# Patient Record
Sex: Female | Born: 1985 | Race: White | Hispanic: No | Marital: Married | Smoking: Never smoker
Health system: Southern US, Community
[De-identification: ages and names within clinical notes are randomized; demographics above are authoritative.]

## PROBLEM LIST (undated history)

## (undated) DIAGNOSIS — O139 Gestational [pregnancy-induced] hypertension without significant proteinuria, unspecified trimester: Secondary | ICD-10-CM

## (undated) DIAGNOSIS — Z8742 Personal history of other diseases of the female genital tract: Secondary | ICD-10-CM

## (undated) HISTORY — DX: Personal history of other diseases of the female genital tract: Z87.42

## (undated) HISTORY — PX: WISDOM TOOTH EXTRACTION: SHX21

---

## 2018-08-16 DIAGNOSIS — E282 Polycystic ovarian syndrome: Secondary | ICD-10-CM | POA: Insufficient documentation

## 2019-11-26 ENCOUNTER — Encounter: Payer: Self-pay | Admitting: *Deleted

## 2019-11-30 ENCOUNTER — Encounter: Payer: Self-pay | Admitting: Certified Nurse Midwife

## 2019-11-30 ENCOUNTER — Other Ambulatory Visit (HOSPITAL_COMMUNITY)
Admission: RE | Admit: 2019-11-30 | Discharge: 2019-11-30 | Disposition: A | Discharge status: Home / Self Care | Payer: Self-pay | Source: Ambulatory Visit | Attending: Certified Nurse Midwife | Admitting: Certified Nurse Midwife

## 2019-11-30 ENCOUNTER — Other Ambulatory Visit: Payer: Self-pay

## 2019-11-30 ENCOUNTER — Ambulatory Visit (INDEPENDENT_AMBULATORY_CARE_PROVIDER_SITE_OTHER): Payer: Self-pay | Admitting: Certified Nurse Midwife

## 2019-11-30 VITALS — BP 127/79 | HR 61 | Ht 67.0 in | Wt 219.0 lb

## 2019-11-30 DIAGNOSIS — Z3480 Encounter for supervision of other normal pregnancy, unspecified trimester: Secondary | ICD-10-CM

## 2019-11-30 DIAGNOSIS — O0990 Supervision of high risk pregnancy, unspecified, unspecified trimester: Secondary | ICD-10-CM | POA: Insufficient documentation

## 2019-11-30 DIAGNOSIS — O210 Mild hyperemesis gravidarum: Secondary | ICD-10-CM

## 2019-11-30 DIAGNOSIS — O09521 Supervision of elderly multigravida, first trimester: Secondary | ICD-10-CM

## 2019-11-30 DIAGNOSIS — O99210 Obesity complicating pregnancy, unspecified trimester: Secondary | ICD-10-CM

## 2019-11-30 DIAGNOSIS — O09529 Supervision of elderly multigravida, unspecified trimester: Secondary | ICD-10-CM | POA: Insufficient documentation

## 2019-11-30 DIAGNOSIS — Z8759 Personal history of other complications of pregnancy, childbirth and the puerperium: Secondary | ICD-10-CM | POA: Insufficient documentation

## 2019-11-30 MED ORDER — ASPIRIN EC 81 MG PO TBEC: 81.0000 mg | DELAYED_RELEASE_TABLET | Freq: Every day | ORAL | 3 refills | Status: DC

## 2019-11-30 NOTE — Progress Notes (Signed)
Subjective:   Alice Waters is a 34 y.o. G4P3000 at [redacted]w[redacted]d by early ultrasound being seen today for her first obstetrical visit.  Her obstetrical history is significant for advanced maternal age, macrosomia and obesity. Patient does intend to breast feed. Pregnancy history fully reviewed.  Patient reports nausea.  HISTORY: OB History  Gravida Para Term Preterm AB Living  4 3 3  0 0 3  SAB TAB Ectopic Multiple Live Births  0 0 0 0 3    # Outcome Date GA Lbr Len/2nd Weight Sex Delivery Anes PTL Lv  4 Current           3 Term 05/23/13 [redacted]w[redacted]d  10 lb 2.3 oz (4.6 kg) M Vag-Spont   LIV     Birth Comments: IOL, no SD  2 Term 08/26/10 110w0d  9 lb 12 oz (4.423 kg) F Vag-Spont   LIV     Birth Comments: poly, IOL  1 Term 09/24/07 [redacted]w[redacted]d  8 lb 11 oz (3.941 kg) F Vag-Spont   LIV     Birth Comments: poly, IOL, chorio   Past Medical History:  Diagnosis Date  . History of PCOS    Past Surgical History:  Procedure Laterality Date  . WISDOM TOOTH EXTRACTION     Family History  Problem Relation Age of Onset  . Thyroid disease Mother   . Hypertension Father   . COPD Father   . Congestive Heart Failure Father   . Breast cancer Paternal Grandmother    Social History   Tobacco Use  . Smoking status: Never Smoker  . Smokeless tobacco: Never Used  Vaping Use  . Vaping Use: Never used  Substance Use Topics  . Alcohol use: Never  . Drug use: Never   Allergies  Allergen Reactions  . Penicillins Anaphylaxis, Hives and Rash   No current outpatient medications on file prior to visit.   No current facility-administered medications on file prior to visit.    Indications for ASA therapy (per uptodate) One of the following: Previous pregnancy with preeclampsia, especially early onset and with an adverse outcome No Multifetal gestation No Chronic hypertension No Type 1 or 2 diabetes mellitus No Chronic kidney disease No Autoimmune disease (antiphospholipid syndrome, systemic lupus  erythematosus) No  Two or more of the following: Nulliparity No Obesity (body mass index >30 kg/m2) Yes Family history of preeclampsia in mother or sister No Age ?35 years Yes Sociodemographic characteristics (African American race, low socioeconomic level) No Personal risk factors (eg, previous pregnancy with low birth weight or small for gestational age infant, previous adverse pregnancy outcome [eg, stillbirth], interval >10 years between pregnancies) No  Indications for early 1 hour GTT (per uptodate)  BMI >25 (>23 in Asian women) AND one of the following  Gestational diabetes mellitus in a previous pregnancy No Glycated hemoglobin ?5.7 percent (39 mmol/mol), impaired glucose tolerance, or impaired fasting glucose on previous testing No First-degree relative with diabetes No High-risk race/ethnicity (eg, African American, Latino, Native American, [redacted]w[redacted]d American, Pacific Islander) No History of cardiovascular disease No Hypertension or on therapy for hypertension No High-density lipoprotein cholesterol level <35 mg/dL ([place] mmol/L) and/or a triglyceride level >250 mg/dL (6.07 mmol/L) No Polycystic ovary syndrome Yes Physical inactivity No Other clinical condition associated with insulin resistance (eg, severe obesity, acanthosis nigricans) No Previous birth of an infant weighing ?4000 g Yes Previous stillbirth of unknown cause No  Exam   Vitals:   11/30/19 0951 11/30/19 0953  BP: 127/79   Pulse:  61   Weight: 219 lb (99.3 kg)   Height:  5\' 7"  (1.702 m)   Fetal Heart Rate (bpm): 183  Uterus:     Pelvic Exam: Perineum: no hemorrhoids, normal perineum   Vulva: normal external genitalia, no lesions   Vagina:  normal mucosa, normal discharge   Cervix: no lesions and normal, pap smear done    Adnexa: normal adnexa and no mass, fullness, tenderness   Bony Pelvis: average  System: General: well-developed, well-nourished female in no acute distress   Breast:  normal appearance,  no masses or tenderness   Skin: normal coloration and turgor, no rashes   Neurologic: oriented, normal, negative, normal mood   Extremities: normal strength, tone, and muscle mass, ROM of all joints is normal   HEENT PERRLA, extraocular movement intact and sclera clear, anicteric   Mouth/Teeth mucous membranes moist, pharynx normal without lesions and dental hygiene good   Neck supple and no masses   Cardiovascular: regular rate and rhythm   Respiratory:  no respiratory distress, normal breath sounds   Abdomen: soft, non-tender; bowel sounds normal; no masses,  no organomegaly     Assessment:   Pregnancy: G4P3000 Patient Active Problem List   Diagnosis Date Noted  . Supervision of other normal pregnancy, antepartum 11/30/2019  . Advanced maternal age in multigravida 11/30/2019  . History of delivery of macrosomal infant 11/30/2019  . Obesity in pregnancy 11/30/2019  . PCOS (polycystic ovarian syndrome) 08/16/2018    Plan:  1. Supervision of other normal pregnancy, antepartum - Obstetric panel - HIV antibody (with reflex) - Hepatitis C Antibody - Hemoglobinopathy Evaluation - Culture, OB Urine - Cytology - PAP( [place]) - Babyscripts Schedule Optimization - 08/18/2018 bedside; Future  2. Multigravida of advanced maternal age in first trimester - growth [place] in 3rd tri  Initial labs drawn. Early 2 hour GTT planned Start on ASA after 12 weeks Continue prenatal vitamins. Genetic Screening discussed, First trimester screen, Quad screen and NIPS: declined. Ultrasound discussed; fetal anatomic survey: requested. Problem list reviewed and updated The nature of [facility] with multiple MDs and other Advanced Practice Providers was explained to patient; also emphasized that fellows, residents, and students are part of our team. Routine obstetric precautions reviewed Return in about 5 weeks (around 01/04/2020).   01/06/2020, CNM 1:14 PM 11/30/19

## 2019-11-30 NOTE — Progress Notes (Signed)
Bedside U/S shows single IUP with FHT pf 183 BPM and CRL measures 24.60mm  GA [redacted]w[redacted]d

## 2019-11-30 NOTE — Patient Instructions (Signed)
First Trimester of Pregnancy The first trimester of pregnancy is from week 1 until the end of week 13 (months 1 through 3). A week after a sperm fertilizes an egg, the egg will implant on the wall of the uterus. This embryo will begin to develop into a baby. Genes from you and your partner will form the baby. The female genes will determine whether the baby will be a boy or a girl. At 6-8 weeks, the eyes and face will be formed, and the heartbeat can be seen on ultrasound. At the end of 12 weeks, all the baby's organs will be formed. Now that you are pregnant, you will want to do everything you can to have a healthy baby. Two of the most important things are to get good prenatal care and to follow your health care provider's instructions. Prenatal care is all the medical care you receive before the baby's birth. This care will help prevent, find, and treat any problems during the pregnancy and childbirth. Body changes during your first trimester Your body goes through many changes during pregnancy. The changes vary from woman to woman.  You may gain or lose a couple of pounds at first.  You may feel sick to your stomach (nauseous) and you may throw up (vomit). If the vomiting is uncontrollable, call your health care provider.  You may tire easily.  You may develop headaches that can be relieved by medicines. All medicines should be approved by your health care provider.  You may urinate more often. Painful urination may mean you have a bladder infection.  You may develop heartburn as a result of your pregnancy.  You may develop constipation because certain hormones are causing the muscles that push stool through your intestines to slow down.  You may develop hemorrhoids or swollen veins (varicose veins).  Your breasts may begin to grow larger and become tender. Your nipples may stick out more, and the tissue that surrounds them (areola) may become darker.  Your gums may bleed and may be  sensitive to brushing and flossing.  Dark spots or blotches (chloasma, mask of pregnancy) may develop on your face. This will likely fade after the baby is born.  Your menstrual periods will stop.  You may have a loss of appetite.  You may develop cravings for certain kinds of food.  You may have changes in your emotions from day to day, such as being excited to be pregnant or being concerned that something may go wrong with the pregnancy and baby.  You may have more vivid and strange dreams.  You may have changes in your hair. These can include thickening of your hair, rapid growth, and changes in texture. Some women also have hair loss during or after pregnancy, or hair that feels dry or thin. Your hair will most likely return to normal after your baby is born. What to expect at prenatal visits During a routine prenatal visit:  You will be weighed to make sure you and the baby are growing normally.  Your blood pressure will be taken.  Your abdomen will be measured to track your baby's growth.  The fetal heartbeat will be listened to between weeks 10 and 14 of your pregnancy.  Test results from any previous visits will be discussed. Your health care provider may ask you:  How you are feeling.  If you are feeling the baby move.  If you have had any abnormal symptoms, such as leaking fluid, bleeding, severe headaches, or abdominal   cramping.  If you are using any tobacco products, including cigarettes, chewing tobacco, and electronic cigarettes.  If you have any questions. Other tests that may be performed during your first trimester include:  Blood tests to find your blood type and to check for the presence of any previous infections. The tests will also be used to check for low iron levels (anemia) and protein on red blood cells (Rh antibodies). Depending on your risk factors, or if you previously had diabetes during pregnancy, you may have tests to check for high blood sugar  that affects pregnant women (gestational diabetes).  Urine tests to check for infections, diabetes, or protein in the urine.  An ultrasound to confirm the proper growth and development of the baby.  Fetal screens for spinal cord problems (spina bifida) and Down syndrome.  HIV (human immunodeficiency virus) testing. Routine prenatal testing includes screening for HIV, unless you choose not to have this test.  You may need other tests to make sure you and the baby are doing well. Follow these instructions at home: Medicines  Follow your health care provider's instructions regarding medicine use. Specific medicines may be either safe or unsafe to take during pregnancy.  Take a prenatal vitamin that contains at least 600 micrograms (mcg) of folic acid.  If you develop constipation, try taking a stool softener if your health care provider approves. Eating and drinking   Eat a balanced diet that includes fresh fruits and vegetables, whole grains, good sources of protein such as meat, eggs, or tofu, and low-fat dairy. Your health care provider will help you determine the amount of weight gain that is right for you.  Avoid raw meat and uncooked cheese. These carry germs that can cause birth defects in the baby.  Eating four or five small meals rather than three large meals a day may help relieve nausea and vomiting. If you start to feel nauseous, eating a few soda crackers can be helpful. Drinking liquids between meals, instead of during meals, also seems to help ease nausea and vomiting.  Limit foods that are high in fat and processed sugars, such as fried and sweet foods.  To prevent constipation: ? Eat foods that are high in fiber, such as fresh fruits and vegetables, whole grains, and beans. ? Drink enough fluid to keep your urine clear or pale yellow. Activity  Exercise only as directed by your health care provider. Most women can continue their usual exercise routine during  pregnancy. Try to exercise for 30 minutes at least 5 days a week. Exercising will help you: ? Control your weight. ? Stay in shape. ? Be prepared for labor and delivery.  Experiencing pain or cramping in the lower abdomen or lower back is a good sign that you should stop exercising. Check with your health care provider before continuing with normal exercises.  Try to avoid standing for long periods of time. Move your legs often if you must stand in one place for a long time.  Avoid heavy lifting.  Wear low-heeled shoes and practice good posture.  You may continue to have sex unless your health care provider tells you not to. Relieving pain and discomfort  Wear a good support bra to relieve breast tenderness.  Take warm sitz baths to soothe any pain or discomfort caused by hemorrhoids. Use hemorrhoid cream if your health care provider approves.  Rest with your legs elevated if you have leg cramps or low back pain.  If you develop varicose veins in   your legs, wear support hose. Elevate your feet for 15 minutes, 3-4 times a day. Limit salt in your diet. Prenatal care  Schedule your prenatal visits by the twelfth week of pregnancy. They are usually scheduled monthly at first, then more often in the last 2 months before delivery.  Write down your questions. Take them to your prenatal visits.  Keep all your prenatal visits as told by your health care provider. This is important. Safety  Wear your seat belt at all times when driving.  Make a list of emergency phone numbers, including numbers for family, friends, the hospital, and police and fire departments. General instructions  Ask your health care provider for a referral to a local prenatal education class. Begin classes no later than the beginning of month 6 of your pregnancy.  Ask for help if you have counseling or nutritional needs during pregnancy. Your health care provider can offer advice or refer you to specialists for help  with various needs.  Do not use hot tubs, steam rooms, or saunas.  Do not douche or use tampons or scented sanitary pads.  Do not cross your legs for long periods of time.  Avoid cat litter boxes and soil used by cats. These carry germs that can cause birth defects in the baby and possibly loss of the fetus by miscarriage or stillbirth.  Avoid all smoking, herbs, alcohol, and medicines not prescribed by your health care provider. Chemicals in these products affect the formation and growth of the baby.  Do not use any products that contain nicotine or tobacco, such as cigarettes and e-cigarettes. If you need help quitting, ask your health care provider. You may receive counseling support and other resources to help you quit.  Schedule a dentist appointment. At home, brush your teeth with a soft toothbrush and be gentle when you floss. Contact a health care provider if:  You have dizziness.  You have mild pelvic cramps, pelvic pressure, or nagging pain in the abdominal area.  You have persistent nausea, vomiting, or diarrhea.  You have a bad smelling vaginal discharge.  You have pain when you urinate.  You notice increased swelling in your face, hands, legs, or ankles.  You are exposed to fifth disease or chickenpox.  You are exposed to [place] measles (rubella) and have never had it. Get help right away if:  You have a fever.  You are leaking fluid from your vagina.  You have spotting or bleeding from your vagina.  You have severe abdominal cramping or pain.  You have rapid weight gain or loss.  You vomit blood or material that looks like coffee grounds.  You develop a severe headache.  You have shortness of breath.  You have any kind of trauma, such as from a fall or a car accident. Summary  The first trimester of pregnancy is from week 1 until the end of week 13 (months 1 through 3).  Your body goes through many changes during pregnancy. The changes vary from  woman to woman.  You will have routine prenatal visits. During those visits, your health care provider will examine you, discuss any test results you may have, and talk with you about how you are feeling. This information is not intended to replace advice given to you by your health care provider. Make sure you discuss any questions you have with your health care provider. Document Revised: 12/17/2016 Document Reviewed: 12/17/2015 Elsevier Patient Education  2020 Elsevier Inc.   Morning Sickness  Morning  sickness is when you feel sick to your stomach (nauseous) during pregnancy. You may feel sick to your stomach and throw up (vomit). You may feel sick in the morning, but you can feel this way at any time of day. Some women feel very sick to their stomach and cannot stop throwing up (hyperemesis gravidarum). Follow these instructions at home: Medicines  Take over-the-counter and prescription medicines only as told by your doctor. Do not take any medicines until you talk with your doctor about them first.  Taking multivitamins before getting pregnant can stop or lessen the harshness of morning sickness. Eating and drinking  Eat dry toast or crackers before getting out of bed.  Eat 5 or 6 small meals a day.  Eat dry and bland foods like rice and baked potatoes.  Do not eat greasy, fatty, or spicy foods.  Have someone cook for you if the smell of food causes you to feel sick or throw up.  If you feel sick to your stomach after taking prenatal vitamins, take them at night or with a snack.  Eat protein when you need a snack. Nuts, yogurt, and cheese are good choices.  Drink fluids throughout the day.  Try ginger ale made with real ginger, ginger tea made from fresh grated ginger, or ginger candies. General instructions  Do not use any products that have nicotine or tobacco in them, such as cigarettes and e-cigarettes. If you need help quitting, ask your doctor.  Use an air purifier  to keep the air in your house free of smells.  Get lots of fresh air.  Try to avoid smells that make you feel sick.  Try: ? Wearing a bracelet that is used for seasickness (acupressure wristband). ? Going to a doctor who puts thin needles into certain body points (acupuncture) to improve how you feel. Contact a doctor if:  You need medicine to feel better.  You feel dizzy or light-headed.  You are losing weight. Get help right away if:  You feel very sick to your stomach and cannot stop throwing up.  You pass out (faint).  You have very bad pain in your belly. Summary  Morning sickness is when you feel sick to your stomach (nauseous) during pregnancy.  You may feel sick in the morning, but you can feel this way at any time of day.  Making some changes to what you eat may help your symptoms go away. This information is not intended to replace advice given to you by your health care provider. Make sure you discuss any questions you have with your health care provider. Document Revised: 12/17/2016 Document Reviewed: 02/05/2016 Elsevier Patient Education  2020 [employer].                   Safe Medications in Pregnancy    Acne: Benzoyl Peroxide Salicylic Acid  Backache/Headache: Tylenol: 2 regular strength every 4 hours OR              2 Extra strength every 6 hours  Colds/Coughs/Allergies: Benadryl (alcohol free) 25 mg every 6 hours as needed Breath right strips Claritin Cepacol throat lozenges Chloraseptic throat spray Cold-Eeze- up to three times per day Cough drops, alcohol free Flonase (by prescription only) Guaifenesin Mucinex Robitussin DM (plain only, alcohol free) Saline nasal spray/drops Sudafed (pseudoephedrine) & Actifed ** use only after [redacted] weeks gestation and if you do not have high blood pressure Tylenol Vicks Vaporub Zinc lozenges Zyrtec   Constipation: Colace Ducolax suppositories Fleet  enema Glycerin suppositories Metamucil Milk  of magnesia Miralax Senokot Smooth move tea  Diarrhea: Kaopectate Imodium A-D  *NO pepto Bismol  Hemorrhoids: Anusol Anusol HC Preparation H Tucks  Indigestion: Tums Maalox Mylanta Zantac  Pepcid  Insomnia: Benadryl (alcohol free) 25mg  every 6 hours as needed Tylenol PM Unisom, no Gelcaps  Leg Cramps: Tums MagGel  Nausea/Vomiting:  Bonine Dramamine Emetrol Ginger extract Sea bands Meclizine  Nausea medication to take during pregnancy:  Unisom (doxylamine succinate 25 mg tablets) Take one tablet daily at bedtime. If symptoms are not adequately controlled, the dose can be increased to a maximum recommended dose of two tablets daily (1/2 tablet in the morning, 1/2 tablet mid-afternoon and one at bedtime). Vitamin B6 100mg  tablets. Take one tablet twice a day (up to 200 mg per day).  Skin Rashes: Aveeno products Benadryl cream or 25mg  every 6 hours as needed Calamine Lotion 1% cortisone cream  Yeast infection: Gyne-lotrimin 7 Monistat 7   **If taking multiple medications, please check labels to avoid duplicating the same active ingredients **take medication as directed on the label ** Do not exceed 4000 mg of tylenol in 24 hours **Do not take medications that contain aspirin or ibuprofen

## 2019-12-03 LAB — HEMOGLOBINOPATHY EVALUATION
Fetal Hemoglobin Testing: <1 % (ref 0.0–1.9)
HCT: 43.7 % (ref 35.0–45.0)
Hemoglobin A2 - HGBRFX: 2.3 % (ref 2.2–3.2)
Hemoglobin: 14 g/dL (ref 11.7–15.5)
Hgb A: 97.7 % (ref >96.0)
MCH: 29.7 pg (ref 27.0–33.0)
MCV: 92.6 fL (ref 80.0–100.0)
RBC: 4.72 10*6/uL (ref 3.80–5.10)
RDW: 12.5 % (ref 11.0–15.0)

## 2019-12-03 LAB — OBSTETRIC PANEL
Absolute Monocytes: 863 cells/uL (ref 200–950)
Antibody Screen: NOT DETECTED
Basophils Absolute: 38 cells/uL (ref 0–200)
Basophils Relative: 0.3 %
Eosinophils Absolute: 75 cells/uL (ref 15–500)
Eosinophils Relative: 0.6 %
HCT: 41.6 % (ref 35.0–45.0)
Hemoglobin: 14.1 g/dL (ref 11.7–15.5)
Hepatitis B Surface Ag: NONREACTIVE
Lymphs Abs: 1625 cells/uL (ref 850–3900)
MCH: 30.5 pg (ref 27.0–33.0)
MCHC: 33.9 g/dL (ref 32.0–36.0)
MCV: 89.8 fL (ref 80.0–100.0)
MPV: 10.9 fL (ref 7.5–12.5)
Monocytes Relative: 6.9 %
Neutro Abs: 9900 cells/uL — ABNORMAL HIGH (ref 1500–7800)
Neutrophils Relative %: 79.2 %
Platelets: 321 10*3/uL (ref 140–400)
RBC: 4.63 10*6/uL (ref 3.80–5.10)
RDW: 12.6 % (ref 11.0–15.0)
RPR Ser Ql: NONREACTIVE
Rubella: 1.68 Index
Total Lymphocyte: 13 %
WBC: 12.5 10*3/uL — ABNORMAL HIGH (ref 3.8–10.8)

## 2019-12-03 LAB — HEPATITIS C ANTIBODY
Hepatitis C Ab: NONREACTIVE
SIGNAL TO CUT-OFF: 0.01 (ref <1.00)

## 2019-12-03 LAB — URINE CULTURE, OB REFLEX

## 2019-12-03 LAB — CULTURE, OB URINE

## 2019-12-03 LAB — HIV ANTIBODY (ROUTINE TESTING W REFLEX): HIV 1&2 Ab, 4th Generation: NONREACTIVE

## 2019-12-04 LAB — CYTOLOGY - PAP
Chlamydia: NEGATIVE
Comment: NEGATIVE
Comment: NEGATIVE
Comment: NORMAL
Diagnosis: NEGATIVE
High risk HPV: NEGATIVE
Neisseria Gonorrhea: NEGATIVE

## 2020-01-04 ENCOUNTER — Telehealth (INDEPENDENT_AMBULATORY_CARE_PROVIDER_SITE_OTHER): Payer: Self-pay | Admitting: Certified Nurse Midwife

## 2020-01-04 ENCOUNTER — Telehealth: Payer: Self-pay | Admitting: *Deleted

## 2020-01-04 ENCOUNTER — Other Ambulatory Visit: Payer: Self-pay | Admitting: Certified Nurse Midwife

## 2020-01-04 ENCOUNTER — Other Ambulatory Visit: Payer: Self-pay

## 2020-01-04 VITALS — BP 116/89 | HR 72 | Wt 217.0 lb

## 2020-01-04 DIAGNOSIS — Z3480 Encounter for supervision of other normal pregnancy, unspecified trimester: Secondary | ICD-10-CM

## 2020-01-04 DIAGNOSIS — Z363 Encounter for antenatal screening for malformations: Secondary | ICD-10-CM

## 2020-01-04 DIAGNOSIS — Z3A19 19 weeks gestation of pregnancy: Secondary | ICD-10-CM

## 2020-01-04 DIAGNOSIS — Z3A14 14 weeks gestation of pregnancy: Secondary | ICD-10-CM

## 2020-01-04 DIAGNOSIS — O09529 Supervision of elderly multigravida, unspecified trimester: Secondary | ICD-10-CM

## 2020-01-04 DIAGNOSIS — O09522 Supervision of elderly multigravida, second trimester: Secondary | ICD-10-CM

## 2020-01-04 DIAGNOSIS — Z8759 Personal history of other complications of pregnancy, childbirth and the puerperium: Secondary | ICD-10-CM

## 2020-01-04 DIAGNOSIS — O99212 Obesity complicating pregnancy, second trimester: Secondary | ICD-10-CM

## 2020-01-04 DIAGNOSIS — O99210 Obesity complicating pregnancy, unspecified trimester: Secondary | ICD-10-CM

## 2020-01-04 DIAGNOSIS — E669 Obesity, unspecified: Secondary | ICD-10-CM

## 2020-01-04 DIAGNOSIS — O09292 Supervision of pregnancy with other poor reproductive or obstetric history, second trimester: Secondary | ICD-10-CM

## 2020-01-04 NOTE — Telephone Encounter (Signed)
Left patient a message with appointment information for MFM on 02/07/2020. Needs to arrive at 10:30 AM for 10:45 AM appointment.

## 2020-01-04 NOTE — Progress Notes (Signed)
Pt declines genetics

## 2020-01-04 NOTE — Progress Notes (Signed)
° °  TELEHEALTH OBSTETRICS VISIT ENCOUNTER NOTE  I connected with Alice Waters on 01/04/20 at  8:10 AM EST by telephone at home and verified that I am speaking with the correct person using two identifiers.   I discussed the limitations, risks, security and privacy concerns of performing an evaluation and management service by telephone and the availability of in person appointments. I also discussed with the patient that there may be a patient responsible charge related to this service. The patient expressed understanding and agreed to proceed.  Subjective:  Alice Waters is a 33 y.o. G4P3003 at [redacted]w[redacted]d being followed for ongoing prenatal care.  She is currently monitored for the following issues for this low-risk pregnancy and has PCOS (polycystic ovarian syndrome); Supervision of other normal pregnancy, antepartum; Advanced maternal age in multigravida; History of delivery of macrosomal infant; and Obesity in pregnancy on their problem list.  Patient reports nausea and vomiting. Feels nausea daily but vomiting occasionally. Used Unisom and B6 a few times and peppermint tea helps. Reports usually resolved in previous pregnancies around 15 weeks. Reports fetal movement. Denies any contractions, bleeding or leaking of fluid.   The following portions of the patient's history were reviewed and updated as appropriate: allergies, current medications, past family history, past medical history, past social history, past surgical history and problem list.   Objective:   General:  Alert, oriented and cooperative.   Mental Status: Normal mood and affect perceived. Normal judgment and thought content.  Rest of physical exam deferred due to type of encounter  Assessment and Plan:  Pregnancy: G4P3003 at [redacted]w[redacted]d 1. [redacted] weeks gestation of pregnancy  2. Supervision of other normal pregnancy, antepartum - anatomy US at 18-19w 3. Antepartum multigravida of advanced maternal age  63. History of delivery of  macrosomal infant - needs early GTT  5. Obesity in pregnancy  Preterm labor symptoms and general obstetric precautions including but not limited to vaginal bleeding, contractions, leaking of fluid and fetal movement were reviewed in detail with the patient.  I discussed the assessment and treatment plan with the patient. The patient was provided an opportunity to ask questions and all were answered. The patient agreed with the plan and demonstrated an understanding of the instructions. The patient was advised to call back or seek an in-person office evaluation/go to [facility] at [facility] for any urgent or concerning symptoms. Please refer to After Visit Summary for other counseling recommendations.   I provided 7 minutes of non-face-to-face time during this encounter.  Return in about 6 weeks (around 02/15/2020). in-person or virtual  No future appointments.  Donette Larry, CNM [facility] [employer]

## 2020-01-19 NOTE — L&D Delivery Note (Addendum)
Delivery Note Alice Waters is a 35 y.o. G4P3003 at [redacted]w[redacted]d admitted for IOL for gHTN.   GBS Status: Positive/-- (05/20 0000) treated with clindamycin (PCN allergy) Maximum Maternal Temperature: 98.31F  Labor course: Initial SVE: 2/thick/-3. Augmentation with: AROM, Pitocin and Cytotec. She then progressed to complete.  ROM: 4h 72m with clear fluid  Birth: At 2247 a viable female was delivered via spontaneous vaginal delivery (Presentation: ROA  ). Nuchal cord present: No.  Shoulders and body delivered in usual fashion. Infant placed directly on mom's abdomen for bonding/skin-to-skin, baby dried and stimulated. Cord clamped x 2 after 1 minute and cut by FOB.  Cord blood collected.  The placenta separated spontaneously and delivered via gentle cord traction.  Pitocin infused rapidly IV per protocol.  Fundus firm with massage.  Placenta inspected and appears to be intact with a 3 VC.  Placenta/Cord with the following complications: none .  Cord pH: n/a Sponge and instrument count were correct x2.  Intrapartum complications:  PIH Anesthesia:  epidural Episiotomy: none Lacerations:  2nd degree and vaginal Suture Repair: 3.0 vicryl EBL (mL): 628   Infant: APGAR (1 MIN):  8 APGAR (5 MINS):   9 Infant weight: pending  Mom to postpartum.  Baby to Couplet care / Skin to Skin. Placenta to L&D   Plans to Breastfeed Contraception: vasectomy/NFP Circumcision: N/A  Note sent to [facility]: KV for pp visit.  [name], MD 06/12/2020 11:36 PM  The above was performed under my direct supervision and guidance.

## 2020-01-21 ENCOUNTER — Other Ambulatory Visit: Payer: Self-pay

## 2020-01-21 ENCOUNTER — Other Ambulatory Visit (INDEPENDENT_AMBULATORY_CARE_PROVIDER_SITE_OTHER): Payer: Self-pay

## 2020-01-21 DIAGNOSIS — O99210 Obesity complicating pregnancy, unspecified trimester: Secondary | ICD-10-CM

## 2020-01-21 DIAGNOSIS — Z3480 Encounter for supervision of other normal pregnancy, unspecified trimester: Secondary | ICD-10-CM

## 2020-01-21 NOTE — Progress Notes (Signed)
Pt here for early GTT only 

## 2020-01-22 LAB — 2HR GTT W 1 HR, CARPENTER, 75 G
Glucose, 1 Hr, Gest: 118 mg/dL (ref 65–179)
Glucose, 2 Hr, Gest: 98 mg/dL (ref 65–152)
Glucose, Fasting, Gest: 79 mg/dL (ref 65–91)

## 2020-01-28 ENCOUNTER — Telehealth: Payer: Self-pay

## 2020-01-28 NOTE — Telephone Encounter (Signed)
01/29/20 GFE being mailed 

## 2020-01-28 NOTE — Telephone Encounter (Signed)
Calculated the patient's GFE for their upcoming appointment.   Mailed GFE to patient.

## 2020-02-07 ENCOUNTER — Other Ambulatory Visit: Payer: Self-pay

## 2020-02-07 ENCOUNTER — Ambulatory Visit: Payer: Self-pay | Attending: Certified Nurse Midwife

## 2020-02-07 ENCOUNTER — Other Ambulatory Visit: Payer: Self-pay | Admitting: *Deleted

## 2020-02-07 ENCOUNTER — Other Ambulatory Visit: Payer: Self-pay | Admitting: Certified Nurse Midwife

## 2020-02-07 DIAGNOSIS — Z363 Encounter for antenatal screening for malformations: Secondary | ICD-10-CM | POA: Insufficient documentation

## 2020-02-07 DIAGNOSIS — Z3A19 19 weeks gestation of pregnancy: Secondary | ICD-10-CM

## 2020-02-07 DIAGNOSIS — Z362 Encounter for other antenatal screening follow-up: Secondary | ICD-10-CM

## 2020-02-07 DIAGNOSIS — Z3480 Encounter for supervision of other normal pregnancy, unspecified trimester: Secondary | ICD-10-CM | POA: Insufficient documentation

## 2020-02-15 ENCOUNTER — Telehealth (INDEPENDENT_AMBULATORY_CARE_PROVIDER_SITE_OTHER): Payer: Self-pay | Admitting: Certified Nurse Midwife

## 2020-02-15 DIAGNOSIS — Z8759 Personal history of other complications of pregnancy, childbirth and the puerperium: Secondary | ICD-10-CM

## 2020-02-15 DIAGNOSIS — O99210 Obesity complicating pregnancy, unspecified trimester: Secondary | ICD-10-CM

## 2020-02-15 DIAGNOSIS — E669 Obesity, unspecified: Secondary | ICD-10-CM

## 2020-02-15 DIAGNOSIS — O09529 Supervision of elderly multigravida, unspecified trimester: Secondary | ICD-10-CM

## 2020-02-15 DIAGNOSIS — R109 Unspecified abdominal pain: Secondary | ICD-10-CM

## 2020-02-15 DIAGNOSIS — Z3A20 20 weeks gestation of pregnancy: Secondary | ICD-10-CM

## 2020-02-15 DIAGNOSIS — Z3480 Encounter for supervision of other normal pregnancy, unspecified trimester: Secondary | ICD-10-CM

## 2020-02-15 DIAGNOSIS — O99212 Obesity complicating pregnancy, second trimester: Secondary | ICD-10-CM

## 2020-02-15 DIAGNOSIS — O09522 Supervision of elderly multigravida, second trimester: Secondary | ICD-10-CM

## 2020-02-15 DIAGNOSIS — E282 Polycystic ovarian syndrome: Secondary | ICD-10-CM

## 2020-02-15 DIAGNOSIS — O26892 Other specified pregnancy related conditions, second trimester: Secondary | ICD-10-CM

## 2020-02-15 DIAGNOSIS — O99282 Endocrine, nutritional and metabolic diseases complicating pregnancy, second trimester: Secondary | ICD-10-CM

## 2020-02-15 NOTE — Patient Instructions (Signed)

## 2020-02-15 NOTE — Progress Notes (Signed)
OBSTETRICS PRENATAL VIRTUAL VISIT ENCOUNTER NOTE  Provider location: Center for [facility] [place]   I connected with Alice Waters on 02/15/20 at  8:10 AM EST by MyChart Video Encounter at home and verified that I am speaking with the correct person using two identifiers.   I discussed the limitations, risks, security and privacy concerns of performing an evaluation and management service virtually and the availability of in person appointments. I also discussed with the patient that there may be a patient responsible charge related to this service. The patient expressed understanding and agreed to proceed. Subjective:  Shawntay Ketcham is a 35 y.o. [phone] at [redacted]w[redacted]d being seen today for ongoing prenatal care.  She is currently monitored for the following issues for this low-risk pregnancy and has PCOS (polycystic ovarian syndrome); Supervision of other normal pregnancy, antepartum; Advanced maternal age in multigravida; History of delivery of macrosomal infant; and Obesity in pregnancy on their problem list.  Patient reports had some pain under ribs after she ate, lyed down and it improved, has this while pregnant with her son.  Contractions: Not present. Vag. Bleeding: None.  Movement: Present. Denies any leaking of fluid.   The following portions of the patient's history were reviewed and updated as appropriate: allergies, current medications, past family history, past medical history, past social history, past surgical history and problem list.   Objective:   Vitals:   02/15/20 0806  BP: 138/83  Weight: 220 lb (99.8 kg)    Fetal Status:     Movement: Present     General:  Alert, oriented and cooperative. Patient is in no acute distress.  Respiratory: Normal respiratory effort, no problems with respiration noted  Mental Status: Normal mood and affect. Normal behavior. Normal judgment and thought content.  Rest of physical exam deferred due to type of  encounter  Imaging: [place] MFM OB DETAIL +14 WK  Result Date: 02/07/2020 ----------------------------------------------------------------------  OBSTETRICS REPORT                       (Signed Final 02/07/2020 12:40 pm) ---------------------------------------------------------------------- Patient Info  ID #:       [id number]                          D.O.B.:  [birth year] (34 yrs)  Name:       Alice Waters                  Visit Date: 02/07/2020 12:06 pm ---------------------------------------------------------------------- Performed By  Attending:        Ma Rings MD         Ref. Address:     [address] [place]  Performed By:     Eden Lathe BS      [facility]  Referred By:      Everardo [facility] [place] ---------------------------------------------------------------------- Orders  #  Description                           Code        Ordered By  1  US MFM OB DETAIL +14 WK               76811.01    Sheryle Vice ----------------------------------------------------------------------  #  Order #                     Accession #                Episode #  1  [id number]                   [id number]                 [id number] ---------------------------------------------------------------------- Indications  Advanced maternal age multigravida 1235+,        15O09.522  second trimester  3619 weeks gestation of pregnancy                Z3A.19  Encounter for antenatal screening for          Z36.3  malformations  Obesity complicating pregnancy, second         O99.212  trimester  Declined genetic screening  Poor obstetric history: Prior fetal            O09.299  macrosomia, antepartum  ---------------------------------------------------------------------- Fetal Evaluation  Num Of Fetuses:         1  Fetal Heart Rate(bpm):  141  Cardiac Activity:       Observed  Presentation:           Variable  Placenta:               Anterior  P. Cord Insertion:      Visualized  Amniotic Fluid  AFI FV:      Within normal limits                              Largest Pocket(cm)                              7 ---------------------------------------------------------------------- Biometry  BPD:      44.8  mm     G. Age:  19w 4d         74  %    CI:        75.34   %    70 - 86  FL/HC:      18.3   %    16.1 - 18.3  HC:      163.7  mm     G. Age:  19w 1d         48  %    HC/AC:      1.19        1.09 - 1.39  AC:      137.1  mm     G. Age:  19w 1d         51  %    FL/BPD:     67.0   %  FL:         30  mm     G. Age:  19w 2d         53  %    FL/AC:      21.9   %    20 - 24  HUM:        29  mm     G. Age:  19w 3d         61  %  CER:      19.8  mm     G. Age:  19w 1d         43  %  NFT:       5.3  mm  LV:        7.2  mm  CM:        3.4  mm  Est. FW:     279  gm    0 lb 10 oz      57  % ---------------------------------------------------------------------- OB History  Gravidity:    4         Term:   3        Prem:   0        SAB:   0  TOP:          0       Ectopic:  0        Living: 3 ---------------------------------------------------------------------- Gestational Age  LMP:           20w 1d        Date:  09/19/19                 EDD:   06/25/20  U/S Today:     19w 2d                                        EDD:   07/01/20  Best:          19w 0d     Det. ByMarcella Dubs         EDD:   07/03/20                                      (11/30/19) ---------------------------------------------------------------------- Anatomy  Cranium:               Appears normal         LVOT:                   Appears normal  Cavum:                 Appears normal  Aortic Arch:             Appears normal  Ventricles:            Appears normal         Ductal Arch:            Appears normal  Choroid Plexus:        Appears normal         Diaphragm:              Appears normal  Cerebellum:            Appears normal         Stomach:                Appears normal, left                                                                        sided  Posterior Fossa:       Appears normal         Abdomen:                Appears normal  Nuchal Fold:           Appears normal         Abdominal Wall:         Appears nml (cord                                                                        insert, abd wall)  Face:                  Appears normal         Cord Vessels:           Appears normal (3                         (orbits and profile)                           vessel cord)  Lips:                  Appears normal         Kidneys:                Appear normal  Palate:                Appears normal         Bladder:                Appears normal  Thoracic:              Appears normal         Spine:                  Not well visualized  Heart:  Appears normal         Upper Extremities:      Appears normal                         (4CH, axis, and                         situs)  RVOT:                  Appears normal         Lower Extremities:      Appears normal  Other:  Heels/feet and open hands/5th digits visualized. Nasal bone          visualized. Lenses visualized. Fetus appears to be female.          Technically difficult due to fetal position. ---------------------------------------------------------------------- Cervix Uterus Adnexa  Cervix  Length:           3.99  cm.  Normal appearance by transabdominal scan.  Uterus  No abnormality visualized.  Right Ovary  Within normal limits.  Left Ovary  Within normal limits.  Cul De Sac  No free fluid seen.  Adnexa  No abnormality visualized. ---------------------------------------------------------------------- Comments  This patient was seen for  a detailed fetal anatomy scan due  to advanced maternal age.  She denies any significant past medical history and denies  any problems in her current pregnancy.  She has declined all screening tests for fetal aneuploidy in  her current pregnancy.  She was informed that the fetal growth and amniotic fluid  level were appropriate for her gestational age.  There were no obvious fetal anomalies noted on today's  ultrasound exam.  However, the views of the fetal spine were  limited today due to the fetal position.  The patient was informed that anomalies may be missed due  to technical limitations. If the fetus is in a suboptimal position  or maternal habitus is increased, visualization of the fetus in  the maternal uterus may be impaired.  The increased risk of fetal aneuploidy due to advanced  maternal age was discussed. Due to advanced maternal age,  the patient was offered and declined an amniocentesis today  for definitive diagnosis of fetal aneuploidy.  A follow-up exam was scheduled in 4 weeks to complete the  views of the fetal anatomy. ----------------------------------------------------------------------                   Ma Rings, MD Electronically Signed Final Report   02/07/2020 12:40 pm ----------------------------------------------------------------------   Assessment and Plan:  Pregnancy: O1B5102 at [redacted]w[redacted]d 1. Supervision of other normal pregnancy, antepartum  2. Antepartum multigravida of advanced maternal age -growth [place] in 3rd tri  3. History of delivery of macrosomal infant -normal early GTT  4. Obesity in pregnancy -weight gain stable, total 10lbs  5. Abdominal pain in pregnancy -GERD vs hernia -check for hernia at nv  Preterm labor symptoms and general obstetric precautions including but not limited to vaginal bleeding, contractions, leaking of fluid and fetal movement were reviewed in detail with the patient. I discussed the assessment and treatment plan with the patient. The  patient was provided an opportunity to ask questions and all were answered. The patient agreed with the plan and demonstrated an understanding of the instructions. The patient was advised to call back or seek an in-person office evaluation/go to MAU at [facility] for any urgent or concerning symptoms.  Please refer to After Visit Summary for other counseling recommendations.   I provided 12 minutes of face-to-face time during this encounter.  Return in about 4 weeks (around 03/14/2020).  Future Appointments  Date Time Provider Department Center  03/06/2020 10:30 AM [facility] NURSE [facility]  03/06/2020 10:45 AM [facility] US4 [facility]    Donette Larry, CNM Center for [employer], [facility]

## 2020-02-15 NOTE — Progress Notes (Signed)
Pt will take BP and let us know what it is

## 2020-02-25 ENCOUNTER — Telehealth: Payer: Self-pay

## 2020-02-25 NOTE — Telephone Encounter (Signed)
Created GFE for patient's next appt and made it available in MyChart

## 2020-03-06 ENCOUNTER — Encounter: Payer: Self-pay | Admitting: *Deleted

## 2020-03-06 ENCOUNTER — Ambulatory Visit: Payer: Self-pay | Attending: Obstetrics and Gynecology

## 2020-03-06 ENCOUNTER — Ambulatory Visit: Payer: Self-pay | Admitting: *Deleted

## 2020-03-06 ENCOUNTER — Other Ambulatory Visit: Payer: Self-pay | Admitting: *Deleted

## 2020-03-06 ENCOUNTER — Other Ambulatory Visit: Payer: Self-pay

## 2020-03-06 DIAGNOSIS — O09299 Supervision of pregnancy with other poor reproductive or obstetric history, unspecified trimester: Secondary | ICD-10-CM

## 2020-03-06 DIAGNOSIS — O09522 Supervision of elderly multigravida, second trimester: Secondary | ICD-10-CM

## 2020-03-06 DIAGNOSIS — Z362 Encounter for other antenatal screening follow-up: Secondary | ICD-10-CM | POA: Insufficient documentation

## 2020-03-06 DIAGNOSIS — O99212 Obesity complicating pregnancy, second trimester: Secondary | ICD-10-CM

## 2020-03-06 DIAGNOSIS — Z3A23 23 weeks gestation of pregnancy: Secondary | ICD-10-CM

## 2020-03-06 DIAGNOSIS — Z3480 Encounter for supervision of other normal pregnancy, unspecified trimester: Secondary | ICD-10-CM | POA: Insufficient documentation

## 2020-03-06 DIAGNOSIS — O99210 Obesity complicating pregnancy, unspecified trimester: Secondary | ICD-10-CM

## 2020-03-06 DIAGNOSIS — O09292 Supervision of pregnancy with other poor reproductive or obstetric history, second trimester: Secondary | ICD-10-CM

## 2020-03-06 DIAGNOSIS — E669 Obesity, unspecified: Secondary | ICD-10-CM

## 2020-03-06 DIAGNOSIS — Z8759 Personal history of other complications of pregnancy, childbirth and the puerperium: Secondary | ICD-10-CM

## 2020-03-14 ENCOUNTER — Other Ambulatory Visit: Payer: Self-pay

## 2020-03-14 ENCOUNTER — Ambulatory Visit (INDEPENDENT_AMBULATORY_CARE_PROVIDER_SITE_OTHER): Payer: Self-pay | Admitting: Certified Nurse Midwife

## 2020-03-14 VITALS — BP 115/66 | HR 74 | Wt 230.0 lb

## 2020-03-14 DIAGNOSIS — R399 Unspecified symptoms and signs involving the genitourinary system: Secondary | ICD-10-CM

## 2020-03-14 DIAGNOSIS — M6208 Separation of muscle (nontraumatic), other site: Secondary | ICD-10-CM

## 2020-03-14 DIAGNOSIS — Z3480 Encounter for supervision of other normal pregnancy, unspecified trimester: Secondary | ICD-10-CM

## 2020-03-14 DIAGNOSIS — Z3A24 24 weeks gestation of pregnancy: Secondary | ICD-10-CM

## 2020-03-14 LAB — POCT URINALYSIS DIPSTICK
Bilirubin, UA: NEGATIVE
Blood, UA: NEGATIVE
Glucose, UA: NEGATIVE
Ketones, UA: NEGATIVE
Nitrite, UA: NEGATIVE
Protein, UA: NEGATIVE
Spec Grav, UA: 1.01 (ref 1.010–1.025)
Urobilinogen, UA: NEGATIVE E.U./dL — AB
pH, UA: 7.5 (ref 5.0–8.0)

## 2020-03-14 NOTE — Progress Notes (Addendum)
Subjective:  Alice Waters is a 35 y.o. [phone] at [redacted]w[redacted]d being seen today for ongoing prenatal care.  She is currently monitored for the following issues for this low-risk pregnancy and has PCOS (polycystic ovarian syndrome); Supervision of other normal pregnancy, antepartum; Advanced maternal age in multigravida; History of delivery of macrosomal infant; and Obesity in pregnancy on their problem list.  Patient reports had unilateral back pain a few weeks ago, lasted a couple days, followed by ?hematuria and dysuria, thinks she may had kidney stone, resolved spontaneously..  Contractions: Not present. Vag. Bleeding: None.  Movement: Present. Denies leaking of fluid.   The following portions of the patient's history were reviewed and updated as appropriate: allergies, current medications, past family history, past medical history, past social history, past surgical history and problem list. Problem list updated.  Objective:   Vitals:   03/14/20 0812  BP: 115/66  Pulse: 74  Weight: 230 lb (104.3 kg)    Fetal Status: Fetal Heart Rate (bpm): 154 Fundal Height: 27 cm Movement: Present  Presentation: Undeterminable  General:  Alert, oriented and cooperative. Patient is in no acute distress.  Skin: Skin is warm and dry. No rash noted.   Cardiovascular: Normal heart rate noted  Respiratory: Normal respiratory effort, no problems with respiration noted  Abdomen: Soft, gravid, appropriate for gestational age. Pain/Pressure: Absent   Large diastasis  Pelvic: Vag. Bleeding: None Vag D/C Character: Thin   Cervical exam deferred        Extremities: Normal range of motion.  Edema: None  Mental Status: Normal mood and affect. Normal behavior. Normal judgment and thought content.   Urinalysis:      Results for orders placed or performed in visit on 03/14/20 (from the past 24 hour(s))  POCT Urinalysis Dipstick     Status: Abnormal   Collection Time: 03/14/20  8:45 AM  Result Value Ref Range   Color,  UA straw    Clarity, UA cloudy    Glucose, UA Negative Negative   Bilirubin, UA neg    Ketones, UA neg    Spec Grav, UA 1.010 1.010 - 1.025   Blood, UA neg    pH, UA 7.5 5.0 - 8.0   Protein, UA Negative Negative   Urobilinogen, UA negative (A) 0.2 or 1.0 E.U./dL   Nitrite, UA neg    Leukocytes, UA Trace (A) Negative   Appearance cloudy    Odor malodor    Assessment and Plan:  Pregnancy: G4P3003 at [redacted]w[redacted]d  1. Supervision of other normal pregnancy, antepartum - borderline poly and LGA, f/u US scheduled  2. UTI symptoms - Culture, OB Urine - POCT Urinalysis Dipstick - Culture, OB Urine  3. Abdominal muscle diastasis - maternity belt  4. [redacted] weeks gestation   Preterm labor symptoms and general obstetric precautions including but not limited to vaginal bleeding, contractions, leaking of fluid and fetal movement were reviewed in detail with the patient. Please refer to After Visit Summary for other counseling recommendations.  Return in about 4 weeks (around 04/11/2020).   Donette Larry, CNM

## 2020-03-18 LAB — CULTURE, OB URINE

## 2020-03-18 LAB — URINE CULTURE, OB REFLEX

## 2020-03-27 ENCOUNTER — Telehealth: Payer: Self-pay | Admitting: Obstetrics and Gynecology

## 2020-03-27 NOTE — Telephone Encounter (Signed)
Baby scripts called with elevated blood pressure of 138/97 and some mild shortness of breath.  Pt advised to check blood pressure again in 30 minutes.  If either value was higher than 140/90 she was advised to come to MAU for further evaluation.  Mariel Aloe, MD

## 2020-04-01 ENCOUNTER — Ambulatory Visit (INDEPENDENT_AMBULATORY_CARE_PROVIDER_SITE_OTHER): Payer: Self-pay | Admitting: *Deleted

## 2020-04-01 ENCOUNTER — Encounter: Payer: Self-pay | Admitting: *Deleted

## 2020-04-01 ENCOUNTER — Other Ambulatory Visit: Payer: Self-pay

## 2020-04-01 VITALS — BP 128/74 | HR 92

## 2020-04-01 DIAGNOSIS — O169 Unspecified maternal hypertension, unspecified trimester: Secondary | ICD-10-CM

## 2020-04-01 NOTE — Progress Notes (Signed)
Pt here for BP check and labs per Donette Larry, CNM. BP today in office 128/74  P-92.  Pt denies HA or visual changes today.  She is having Pre-E labs today per [facility]

## 2020-04-02 ENCOUNTER — Encounter: Payer: Self-pay | Admitting: Certified Nurse Midwife

## 2020-04-02 DIAGNOSIS — O139 Gestational [pregnancy-induced] hypertension without significant proteinuria, unspecified trimester: Secondary | ICD-10-CM | POA: Insufficient documentation

## 2020-04-02 DIAGNOSIS — O133 Gestational [pregnancy-induced] hypertension without significant proteinuria, third trimester: Secondary | ICD-10-CM | POA: Insufficient documentation

## 2020-04-02 DIAGNOSIS — O162 Unspecified maternal hypertension, second trimester: Secondary | ICD-10-CM | POA: Insufficient documentation

## 2020-04-02 LAB — CBC
HCT: 34.4 % — ABNORMAL LOW (ref 35.0–45.0)
Hemoglobin: 11.6 g/dL — ABNORMAL LOW (ref 11.7–15.5)
MCH: 30.1 pg (ref 27.0–33.0)
MCHC: 33.7 g/dL (ref 32.0–36.0)
MCV: 89.4 fL (ref 80.0–100.0)
MPV: 10.7 fL (ref 7.5–12.5)
Platelets: 304 10*3/uL (ref 140–400)
RBC: 3.85 10*6/uL (ref 3.80–5.10)
RDW: 12.2 % (ref 11.0–15.0)
WBC: 10.7 10*3/uL (ref 3.8–10.8)

## 2020-04-02 LAB — PROTEIN / CREATININE RATIO, URINE
Creatinine, Urine: 151 mg/dL (ref 20–275)
Protein/Creat Ratio: 113 mg/g creat (ref 21–161)
Protein/Creatinine Ratio: 0.113 mg/mg creat (ref 0.021–0.16)
Total Protein, Urine: 17 mg/dL (ref 5–24)

## 2020-04-02 LAB — COMPREHENSIVE METABOLIC PANEL
AG Ratio: 1.5 (calc) (ref 1.0–2.5)
ALT: 18 U/L (ref 6–29)
AST: 15 U/L (ref 10–30)
Albumin: 3.5 g/dL — ABNORMAL LOW (ref 3.6–5.1)
Alkaline phosphatase (APISO): 90 U/L (ref 31–125)
BUN: 11 mg/dL (ref 7–25)
CO2: 23 mmol/L (ref 20–32)
Calcium: 8.9 mg/dL (ref 8.6–10.2)
Chloride: 105 mmol/L (ref 98–110)
Creat: 0.54 mg/dL (ref 0.50–1.10)
Globulin: 2.4 g/dL (calc) (ref 1.9–3.7)
Glucose, Bld: 93 mg/dL (ref 65–99)
Potassium: 4.3 mmol/L (ref 3.5–5.3)
Sodium: 138 mmol/L (ref 135–146)
Total Bilirubin: 0.3 mg/dL (ref 0.2–1.2)
Total Protein: 5.9 g/dL — ABNORMAL LOW (ref 6.1–8.1)

## 2020-04-10 ENCOUNTER — Other Ambulatory Visit: Payer: Self-pay

## 2020-04-10 ENCOUNTER — Other Ambulatory Visit: Payer: Self-pay | Admitting: Obstetrics

## 2020-04-10 ENCOUNTER — Ambulatory Visit: Payer: Self-pay | Admitting: *Deleted

## 2020-04-10 ENCOUNTER — Ambulatory Visit: Payer: Self-pay | Attending: Obstetrics

## 2020-04-10 ENCOUNTER — Encounter: Payer: Self-pay | Admitting: *Deleted

## 2020-04-10 DIAGNOSIS — O09293 Supervision of pregnancy with other poor reproductive or obstetric history, third trimester: Secondary | ICD-10-CM

## 2020-04-10 DIAGNOSIS — Z8759 Personal history of other complications of pregnancy, childbirth and the puerperium: Secondary | ICD-10-CM | POA: Insufficient documentation

## 2020-04-10 DIAGNOSIS — Z362 Encounter for other antenatal screening follow-up: Secondary | ICD-10-CM

## 2020-04-10 DIAGNOSIS — E669 Obesity, unspecified: Secondary | ICD-10-CM

## 2020-04-10 DIAGNOSIS — Z3480 Encounter for supervision of other normal pregnancy, unspecified trimester: Secondary | ICD-10-CM | POA: Insufficient documentation

## 2020-04-10 DIAGNOSIS — O99210 Obesity complicating pregnancy, unspecified trimester: Secondary | ICD-10-CM | POA: Insufficient documentation

## 2020-04-10 DIAGNOSIS — O09523 Supervision of elderly multigravida, third trimester: Secondary | ICD-10-CM

## 2020-04-10 DIAGNOSIS — O99213 Obesity complicating pregnancy, third trimester: Secondary | ICD-10-CM

## 2020-04-10 DIAGNOSIS — O09299 Supervision of pregnancy with other poor reproductive or obstetric history, unspecified trimester: Secondary | ICD-10-CM | POA: Insufficient documentation

## 2020-04-10 DIAGNOSIS — Z3A28 28 weeks gestation of pregnancy: Secondary | ICD-10-CM

## 2020-04-10 DIAGNOSIS — O162 Unspecified maternal hypertension, second trimester: Secondary | ICD-10-CM | POA: Insufficient documentation

## 2020-04-11 ENCOUNTER — Other Ambulatory Visit: Payer: Self-pay

## 2020-04-11 ENCOUNTER — Ambulatory Visit (INDEPENDENT_AMBULATORY_CARE_PROVIDER_SITE_OTHER): Payer: Self-pay | Admitting: Certified Nurse Midwife

## 2020-04-11 VITALS — BP 113/67 | HR 76 | Wt 237.0 lb

## 2020-04-11 DIAGNOSIS — Z23 Encounter for immunization: Secondary | ICD-10-CM

## 2020-04-11 DIAGNOSIS — Z3480 Encounter for supervision of other normal pregnancy, unspecified trimester: Secondary | ICD-10-CM

## 2020-04-11 DIAGNOSIS — Z8759 Personal history of other complications of pregnancy, childbirth and the puerperium: Secondary | ICD-10-CM

## 2020-04-11 DIAGNOSIS — O3663X0 Maternal care for excessive fetal growth, third trimester, not applicable or unspecified: Secondary | ICD-10-CM

## 2020-04-11 NOTE — Patient Instructions (Signed)

## 2020-04-11 NOTE — Progress Notes (Signed)
Subjective:  Alice Waters is a 35 y.o. [phone] at [redacted]w[redacted]d being seen today for ongoing prenatal care.  She is currently monitored for the following issues for this high-risk pregnancy and has PCOS (polycystic ovarian syndrome); Supervision of other normal pregnancy, antepartum; Advanced maternal age in multigravida; History of delivery of macrosomal infant; Obesity in pregnancy; and Elevated blood pressure affecting pregnancy in second trimester, antepartum on their problem list.  Patient reports no complaints.  Contractions: Not present. Vag. Bleeding: None.  Movement: Present. Denies leaking of fluid.   The following portions of the patient's history were reviewed and updated as appropriate: allergies, current medications, past family history, past medical history, past social history, past surgical history and problem list. Problem list updated.  Objective:   Vitals:   04/11/20 0844  BP: 113/67  Pulse: 76  Weight: 237 lb (107.5 kg)    Fetal Status: Fetal Heart Rate (bpm): 141 Fundal Height: 31 cm Movement: Present  Presentation: Undeterminable  General:  Alert, oriented and cooperative. Patient is in no acute distress.  Skin: Skin is warm and dry. No rash noted.   Cardiovascular: Normal heart rate noted  Respiratory: Normal respiratory effort, no problems with respiration noted  Abdomen: Soft, gravid, appropriate for gestational age. Pain/Pressure: Absent     Pelvic: Vag. Bleeding: None Vag D/C Character: Thin   Cervical exam deferred        Extremities: Normal range of motion.  Edema: Trace  Mental Status: Normal mood and affect. Normal behavior. Normal judgment and thought content.   Urinalysis:      Assessment and Plan:  Pregnancy: G4P3003 at [redacted]w[redacted]d  1. Supervision of other normal pregnancy, antepartum - 2Hr GTT w/ 1 Hr Carpenter 75 g - HIV antibody (with reflex) - CBC - RPR  2. Excessive fetal growth affecting management of pregnancy in third trimester, single or  unspecified fetus - LGA 94%ile on [place] yesterday - hx of macrosomia w/o complications - f/u US in 6 wks per MFM  Preterm labor symptoms and general obstetric precautions including but not limited to vaginal bleeding, contractions, leaking of fluid and fetal movement were reviewed in detail with the patient. Please refer to After Visit Summary for other counseling recommendations.  Return in about 4 weeks (around 05/09/2020).   Donette Larry, CNM

## 2020-04-14 LAB — 2HR GTT W 1 HR, CARPENTER, 75 G
Glucose, 1 Hr, Gest: 126 mg/dL (ref 65–179)
Glucose, 2 Hr, Gest: 100 mg/dL (ref 65–152)
Glucose, Fasting, Gest: 85 mg/dL (ref 65–91)

## 2020-04-14 LAB — CBC
HCT: 34.3 % — ABNORMAL LOW (ref 35.0–45.0)
Hemoglobin: 11.3 g/dL — ABNORMAL LOW (ref 11.7–15.5)
MCH: 29.5 pg (ref 27.0–33.0)
MCHC: 32.9 g/dL (ref 32.0–36.0)
MCV: 89.6 fL (ref 80.0–100.0)
MPV: 10.5 fL (ref 7.5–12.5)
Platelets: 268 10*3/uL (ref 140–400)
RBC: 3.83 10*6/uL (ref 3.80–5.10)
RDW: 12.2 % (ref 11.0–15.0)
WBC: 9.6 10*3/uL (ref 3.8–10.8)

## 2020-04-14 LAB — HIV ANTIBODY (ROUTINE TESTING W REFLEX): HIV 1&2 Ab, 4th Generation: NONREACTIVE

## 2020-04-14 LAB — RPR: RPR Ser Ql: NONREACTIVE

## 2020-04-28 ENCOUNTER — Other Ambulatory Visit: Payer: Self-pay

## 2020-04-28 ENCOUNTER — Ambulatory Visit (INDEPENDENT_AMBULATORY_CARE_PROVIDER_SITE_OTHER): Payer: Self-pay | Admitting: Obstetrics & Gynecology

## 2020-04-28 VITALS — BP 136/87 | HR 98 | Wt 243.0 lb

## 2020-04-28 DIAGNOSIS — Z3A30 30 weeks gestation of pregnancy: Secondary | ICD-10-CM

## 2020-04-28 DIAGNOSIS — Z3689 Encounter for other specified antenatal screening: Secondary | ICD-10-CM

## 2020-04-28 DIAGNOSIS — O169 Unspecified maternal hypertension, unspecified trimester: Secondary | ICD-10-CM

## 2020-04-28 DIAGNOSIS — O133 Gestational [pregnancy-induced] hypertension without significant proteinuria, third trimester: Secondary | ICD-10-CM

## 2020-04-28 MED ORDER — BETAMETHASONE SOD PHOS & ACET 6 (3-3) MG/ML IJ SUSP
12.0000 mg | INTRAMUSCULAR | Status: AC
  Administered 2020-04-28 – 2020-04-29 (×2): 12 mg via INTRAMUSCULAR

## 2020-04-28 NOTE — Progress Notes (Signed)
Pt has been OOT last week and did not put her BP's in BRX.  They have been elevated over the last few days and a slight H/A    PRENATAL VISIT NOTE  Subjective:  Alice Waters is a 35 y.o. [phone] at [redacted]w[redacted]d being seen today for ongoing prenatal care.  She is currently monitored for the following issues for this high-risk pregnancy and has PCOS (polycystic ovarian syndrome); Supervision of other normal pregnancy, antepartum; Advanced maternal age in multigravida; History of delivery of macrosomal infant; Obesity in pregnancy; and Elevated blood pressure affecting pregnancy in second trimester, antepartum on their problem list.  Patient reports headache.  Contractions: Not present. Vag. Bleeding: None.  Movement: Present. Denies leaking of fluid.   The following portions of the patient's history were reviewed and updated as appropriate: allergies, current medications, past family history, past medical history, past social history, past surgical history and problem list.   Objective:   Vitals:   04/28/20 1408  BP: 136/87  Pulse: 98  Weight: 243 lb (110.2 kg)    Fetal Status: Fetal Heart Rate (bpm): 142   Movement: Present     General:  Alert, oriented and cooperative. Patient is in no acute distress.  Skin: Skin is warm and dry. No rash noted.   Cardiovascular: Normal heart rate noted  Respiratory: Normal respiratory effort, no problems with respiration noted  Abdomen: Soft, gravid, appropriate for gestational age.  Pain/Pressure: Absent     Pelvic: Cervical exam deferred        Extremities: Normal range of motion.  Edema: Trace  Mental Status: Normal mood and affect. Normal behavior. Normal judgment and thought content.   Assessment and Plan:  Pregnancy: G4P3003 at [redacted]w[redacted]d  1.  HTN in pregnancy--patient has had multiple blood pressures elevated.  She was traveling if not placed in the Babyscripts.  She also had a headache several times.  She has not taken Tylenol to relieve the  headache.  Blood pressure is normal today.  Labs will be drawn as well as protein creatinine ratio sent.  Patient would qualify for antenatal testing giving at least gestational hypertension.  Maternal-fetal medicine does not have an opening until May.  Patient going to do twice weekly testing in our office until appointment is available.  Patient understands that the NST may not be reactive at 30 weeks and 5 days.  However, given the restriction on appointments this is the antenatal testing we can offer.  She was given strict preeclampsia precautions.  Patient will take her blood pressure daily.  Most likely we will have to pull her off baby [place] because her blood pressure will be elevated.  She will still record her blood pressure so we can review it visits.  Patient understands that she must go to the emergency room with any moderate to severe headache, any blood pressure with a systolic greater than 160 or diastolic greater than 110.  Patient should do fetal kick counts and also report any decreased fetal movement.  Baseline:  140 Accelerations: Present Decelerations:  Absent Variability: Moderate Interpretation:  Reactive NST   Preterm labor symptoms and general obstetric precautions including but not limited to vaginal bleeding, contractions, leaking of fluid and fetal movement were reviewed in detail with the patient. Please refer to After Visit Summary for other counseling recommendations.   No follow-ups on file.  Future Appointments  Date Time Provider Department Center  05/06/2020  9:50 AM Donette Larry, CNM [facility]  05/22/2020  8:45 AM [facility]  NURSE [facility]  05/22/2020 10:00 AM [facility] US1 [facility]    Elsie Lincoln, MD

## 2020-04-29 ENCOUNTER — Telehealth: Payer: Self-pay | Admitting: *Deleted

## 2020-04-29 ENCOUNTER — Ambulatory Visit (INDEPENDENT_AMBULATORY_CARE_PROVIDER_SITE_OTHER): Payer: Self-pay | Admitting: *Deleted

## 2020-04-29 ENCOUNTER — Encounter: Payer: Self-pay | Admitting: *Deleted

## 2020-04-29 VITALS — BP 116/70 | HR 76

## 2020-04-29 DIAGNOSIS — O169 Unspecified maternal hypertension, unspecified trimester: Secondary | ICD-10-CM

## 2020-04-29 LAB — CBC
HCT: 34.2 % — ABNORMAL LOW (ref 35.0–45.0)
Hemoglobin: 11.3 g/dL — ABNORMAL LOW (ref 11.7–15.5)
MCH: 29.1 pg (ref 27.0–33.0)
MCHC: 33 g/dL (ref 32.0–36.0)
MCV: 88.1 fL (ref 80.0–100.0)
MPV: 10.7 fL (ref 7.5–12.5)
Platelets: 290 10*3/uL (ref 140–400)
RBC: 3.88 10*6/uL (ref 3.80–5.10)
RDW: 12.3 % (ref 11.0–15.0)
WBC: 12.3 10*3/uL — ABNORMAL HIGH (ref 3.8–10.8)

## 2020-04-29 LAB — COMPREHENSIVE METABOLIC PANEL
AG Ratio: 1.3 (calc) (ref 1.0–2.5)
ALT: 14 U/L (ref 6–29)
AST: 12 U/L (ref 10–30)
Albumin: 3.4 g/dL — ABNORMAL LOW (ref 3.6–5.1)
Alkaline phosphatase (APISO): 104 U/L (ref 31–125)
BUN: 9 mg/dL (ref 7–25)
CO2: 24 mmol/L (ref 20–32)
Calcium: 8.9 mg/dL (ref 8.6–10.2)
Chloride: 105 mmol/L (ref 98–110)
Creat: 0.55 mg/dL (ref 0.50–1.10)
Globulin: 2.7 g/dL (calc) (ref 1.9–3.7)
Glucose, Bld: 85 mg/dL (ref 65–99)
Potassium: 4.5 mmol/L (ref 3.5–5.3)
Sodium: 138 mmol/L (ref 135–146)
Total Bilirubin: 0.4 mg/dL (ref 0.2–1.2)
Total Protein: 6.1 g/dL (ref 6.1–8.1)

## 2020-04-29 LAB — PROTEIN / CREATININE RATIO, URINE
Creatinine, Urine: 54 mg/dL (ref 20–275)
Protein/Creat Ratio: 130 mg/g creat (ref 21–161)
Protein/Creatinine Ratio: 0.13 mg/mg creat (ref 0.021–0.16)
Total Protein, Urine: 7 mg/dL (ref 5–24)

## 2020-04-29 NOTE — Progress Notes (Signed)
Pt here for 2nd Betamethasone injection.  She states that her HA is gone and her BP in office is 116/70  P-76

## 2020-04-29 NOTE — Telephone Encounter (Signed)
Pt called to report her BP's since yesterday 04/28/20 5:43 pm 144/79  BP 04/29/20 7:31 AM 146/78 with just a slight HA.  Protein/Creat Ratio normal.  Pt states that she is going to take Tylenol and a Zyrtec this morning.  She will continue to monitor BP and if HA gets unbearable she will go to MAU.

## 2020-04-30 ENCOUNTER — Encounter: Payer: Self-pay | Admitting: Obstetrics & Gynecology

## 2020-05-06 ENCOUNTER — Other Ambulatory Visit: Payer: Self-pay

## 2020-05-06 ENCOUNTER — Ambulatory Visit (INDEPENDENT_AMBULATORY_CARE_PROVIDER_SITE_OTHER): Payer: Self-pay | Admitting: Certified Nurse Midwife

## 2020-05-06 VITALS — BP 118/68 | HR 88 | Wt 240.0 lb

## 2020-05-06 DIAGNOSIS — O133 Gestational [pregnancy-induced] hypertension without significant proteinuria, third trimester: Secondary | ICD-10-CM

## 2020-05-06 DIAGNOSIS — Z3480 Encounter for supervision of other normal pregnancy, unspecified trimester: Secondary | ICD-10-CM

## 2020-05-06 NOTE — Progress Notes (Signed)
Subjective:  Alice Waters is a 35 y.o. [phone] at [redacted]w[redacted]d being seen today for ongoing prenatal care.  She is currently monitored for the following issues for this high-risk pregnancy and has PCOS (polycystic ovarian syndrome); Supervision of other normal pregnancy, antepartum; Advanced maternal age in multigravida; History of delivery of macrosomal infant; Obesity in pregnancy; and Gestational hypertension without significant proteinuria on their problem list.  Patient reports no complaints.  Contractions: Not present. Vag. Bleeding: None.  Movement: Present. Denies leaking of fluid.   The following portions of the patient's history were reviewed and updated as appropriate: allergies, current medications, past family history, past medical history, past social history, past surgical history and problem list. Problem list updated.  Objective:   Vitals:   05/06/20 0949  BP: 118/68  Pulse: 88  Weight: 240 lb (108.9 kg)    Fetal Status: Fetal Heart Rate (bpm): 141 Fundal Height: 33 cm Movement: Present  Presentation: Undeterminable  General:  Alert, oriented and cooperative. Patient is in no acute distress.  Skin: Skin is warm and dry. No rash noted.   Cardiovascular: Normal heart rate noted  Respiratory: Normal respiratory effort, no problems with respiration noted  Abdomen: Soft, gravid, appropriate for gestational age. Pain/Pressure: Absent     Pelvic: Vag. Bleeding: None Vag D/C Character: Thin   Cervical exam deferred        Extremities: Normal range of motion.  Edema: None  Mental Status: Normal mood and affect. Normal behavior. Normal judgment and thought content.   Urinalysis:      Assessment and Plan:  Pregnancy: G4P3003 at [redacted]w[redacted]d  1. Supervision of other normal pregnancy, antepartum  2. Gestational hypertension without significant proteinuria in third trimester - BP stable today - ante testing starting next week - delivery at 37 wks  Preterm labor symptoms and general  obstetric precautions including but not limited to vaginal bleeding, contractions, leaking of fluid and fetal movement were reviewed in detail with the patient. Please refer to After Visit Summary for other counseling recommendations.  No follow-ups on file.   Donette Larry, CNM

## 2020-05-06 NOTE — Patient Instructions (Signed)
Hypertension During Pregnancy Hypertension is also called high blood pressure. High blood pressure means that the force of the blood moving in your body is high enough to cause problems for you and your baby. Different types of high blood pressure can happen during pregnancy. The types are:  High blood pressure before you got pregnant. This is called chronic hypertension.  This can continue during your pregnancy. Your doctor will want to keep checking your blood pressure. You may need medicine to control your blood pressure while you are pregnant. You will need follow-up visits after you have your baby.  High blood pressure that goes up during pregnancy when it was normal before. This is called gestational hypertension. It will often get better after you have your baby, but your doctor will need to watch your blood pressure to make sure that it is getting better.  You may develop high blood pressure after giving birth. This is called postpartum hypertension. This often occurs within 48 hours after childbirth but may occur up to 6 weeks after giving birth. Very high blood pressure during pregnancy is an emergency that needs treatment right away. How does this affect me? If you have high blood pressure during pregnancy, you have a higher chance of developing high blood pressure:  As you get older.  If you get pregnant again. In some cases, high blood pressure during pregnancy can cause:  Stroke.  Heart attack.  Damage to the kidneys, lungs, or liver.  Preeclampsia.  HELLP syndrome.  Seizures.  Problems with the placenta. How does this affect my baby? Your baby may:  Be born early.  Not weigh as much as he or she should.  Not handle labor well, leading to a C-section. This condition may also result in a baby's death before birth (stillbirth). What are the risks?  Having high blood pressure during a past pregnancy.  Being overweight.  Being age 35 or older.  Being pregnant  for the first time.  Being pregnant with more than one baby.  Becoming pregnant using fertility methods, such as IVF.  Having other problems, such as diabetes or kidney disease. What can I do to lower my risk?  Keep a healthy weight.  Eat a healthy diet.  Follow what your doctor tells you about treating any medical problems that you had before you got pregnant. It is very important to go to all of your doctor visits. Your doctor will check your blood pressure and make sure that your pregnancy is progressing as it should. Treatment should start early if a problem is found.   How is this treated? Treatment for high blood pressure during pregnancy can vary. It depends on the type of high blood pressure you have and how serious it is.  If you were taking medicine for your blood pressure before you got pregnant, talk with your doctor. You may need to change the medicine during pregnancy if it is not safe for your baby.  If your blood pressure goes up during pregnancy, your doctor may order medicine to treat this.  If you are at risk for preeclampsia, your doctor may tell you to take a low-dose aspirin while you are pregnant.  If you have very high blood pressure, you may need to stay in the hospital so you and your baby can be watched closely. You may also need to take medicine to lower your blood pressure.  In some cases, if your condition gets worse, you may need to have your baby early.   Follow these instructions at home: Eating and drinking  Drink enough fluid to keep your pee (urine) pale yellow.  Avoid caffeine.   Lifestyle  Do not smoke or use any products that contain nicotine or tobacco. If you need help quitting, ask your doctor.  Do not use alcohol or drugs.  Avoid stress.  Rest and get plenty of sleep.  Regular exercise can help. Ask your doctor what kinds of exercise are best for you. General instructions  Take over-the-counter and prescription medicines only as  told by your doctor.  Keep all prenatal and follow-up visits. Contact a doctor if:  You have symptoms that your doctor told you to watch for, such as: ? Headaches. ? A feeling like you may vomit (nausea). ? Vomiting. ? Belly (abdominal) pain. ? Feeling dizzy or light-headed. Get help right away if:  You have symptoms of serious problems, such as: ? Very bad belly pain that does not get better with treatment. ? A very bad headache that does not get better. ? Blurry vision. ? Double vision. ? Vomiting that does not get better. ? Sudden, fast weight gain. ? Sudden swelling in your hands, ankles, or face. ? Bleeding from your vagina. ? Blood in your pee. ? Shortness of breath. ? Chest pain. ? Weakness on one side of your body. ? Trouble talking.  Your baby is not moving as much as usual. These symptoms may be an emergency. Get help right away. Call your local emergency services (911 in the [place]  Do not wait to see if the symptoms will go away.  Do not drive yourself to the hospital. Summary  High blood pressure is also called hypertension.  High blood pressure means that the force of the blood moving in your body is high enough to cause problems for you and your baby.  Get help right away if you have symptoms of serious problems due to high blood pressure.  Keep all prenatal and follow-up visits. This information is not intended to replace advice given to you by your health care provider. Make sure you discuss any questions you have with your health care provider. Document Revised: 09/27/2019 Document Reviewed: 09/27/2019 Elsevier Patient Education  2021 Elsevier Inc.  

## 2020-05-12 ENCOUNTER — Other Ambulatory Visit: Payer: Self-pay

## 2020-05-12 ENCOUNTER — Ambulatory Visit (INDEPENDENT_AMBULATORY_CARE_PROVIDER_SITE_OTHER): Payer: Self-pay | Admitting: *Deleted

## 2020-05-12 DIAGNOSIS — O133 Gestational [pregnancy-induced] hypertension without significant proteinuria, third trimester: Secondary | ICD-10-CM

## 2020-05-12 DIAGNOSIS — O99210 Obesity complicating pregnancy, unspecified trimester: Secondary | ICD-10-CM

## 2020-05-12 DIAGNOSIS — Z8759 Personal history of other complications of pregnancy, childbirth and the puerperium: Secondary | ICD-10-CM

## 2020-05-12 DIAGNOSIS — O09529 Supervision of elderly multigravida, unspecified trimester: Secondary | ICD-10-CM

## 2020-05-12 DIAGNOSIS — Z3480 Encounter for supervision of other normal pregnancy, unspecified trimester: Secondary | ICD-10-CM

## 2020-05-12 NOTE — Progress Notes (Signed)
NST- today is Reactive.  Pt to [facility] on Thursday for NST/AFI

## 2020-05-12 NOTE — Progress Notes (Signed)
NST Reactive

## 2020-05-15 ENCOUNTER — Other Ambulatory Visit: Payer: Self-pay

## 2020-05-15 ENCOUNTER — Ambulatory Visit (INDEPENDENT_AMBULATORY_CARE_PROVIDER_SITE_OTHER): Payer: Self-pay | Admitting: *Deleted

## 2020-05-15 DIAGNOSIS — Z3480 Encounter for supervision of other normal pregnancy, unspecified trimester: Secondary | ICD-10-CM

## 2020-05-15 DIAGNOSIS — Z3A33 33 weeks gestation of pregnancy: Secondary | ICD-10-CM

## 2020-05-15 DIAGNOSIS — O133 Gestational [pregnancy-induced] hypertension without significant proteinuria, third trimester: Secondary | ICD-10-CM

## 2020-05-15 DIAGNOSIS — O99210 Obesity complicating pregnancy, unspecified trimester: Secondary | ICD-10-CM

## 2020-05-15 DIAGNOSIS — Z8759 Personal history of other complications of pregnancy, childbirth and the puerperium: Secondary | ICD-10-CM

## 2020-05-15 DIAGNOSIS — O09529 Supervision of elderly multigravida, unspecified trimester: Secondary | ICD-10-CM

## 2020-05-15 NOTE — Progress Notes (Signed)
AFI within normal limits and NST is reactive.

## 2020-05-21 ENCOUNTER — Other Ambulatory Visit: Payer: Self-pay

## 2020-05-21 ENCOUNTER — Ambulatory Visit (INDEPENDENT_AMBULATORY_CARE_PROVIDER_SITE_OTHER): Payer: Self-pay | Admitting: Obstetrics & Gynecology

## 2020-05-21 VITALS — BP 113/71 | HR 92 | Wt 249.0 lb

## 2020-05-21 DIAGNOSIS — Z3A33 33 weeks gestation of pregnancy: Secondary | ICD-10-CM

## 2020-05-21 DIAGNOSIS — O133 Gestational [pregnancy-induced] hypertension without significant proteinuria, third trimester: Secondary | ICD-10-CM

## 2020-05-21 DIAGNOSIS — Z3480 Encounter for supervision of other normal pregnancy, unspecified trimester: Secondary | ICD-10-CM

## 2020-05-21 NOTE — Progress Notes (Signed)
   PRENATAL VISIT NOTE  Subjective:  Alice Waters is a 35 y.o. [phone] at [redacted]w[redacted]d being seen today for ongoing prenatal care.  She is currently monitored for the following issues for this high-risk pregnancy and has PCOS (polycystic ovarian syndrome); Supervision of other normal pregnancy, antepartum; Advanced maternal age in multigravida; History of delivery of macrosomal infant; Obesity in pregnancy; and Gestational hypertension without significant proteinuria on their problem list.  Patient reports no complaints.  Contractions: Not present. Vag. Bleeding: None.  Movement: Present. Denies leaking of fluid.   The following portions of the patient's history were reviewed and updated as appropriate: allergies, current medications, past family history, past medical history, past social history, past surgical history and problem list.   Objective:   Vitals:   05/21/20 1114  BP: 113/71  Pulse: 92  Weight: 249 lb (112.9 kg)    Fetal Status: Fetal Heart Rate (bpm): 152   Movement: Present     General:  Alert, oriented and cooperative. Patient is in no acute distress.  Skin: Skin is warm and dry. No rash noted.   Cardiovascular: Normal heart rate noted  Respiratory: Normal respiratory effort, no problems with respiration noted  Abdomen: Soft, gravid, appropriate for gestational age.  Pain/Pressure: Absent     Pelvic: Cervical exam deferred        Extremities: Normal range of motion.  Edema: None  Mental Status: Normal mood and affect. Normal behavior. Normal judgment and thought content.   Assessment and Plan:  Pregnancy: G4P3003 at [redacted]w[redacted]d 1. [redacted] weeks gestation of pregnancy  2. Supervision of other normal pregnancy, antepartum OB box completed except for GBS and continued [place]  3. Gestational hypertension without significant proteinuria in third trimester Pt to bring cuff to MFM visit can compare her home cuff to BP at office.  No recent high BPs.  Pre E precautions reviewed.  Pt has BPP  tomorrow.   Preterm labor symptoms and general obstetric precautions including but not limited to vaginal bleeding, contractions, leaking of fluid and fetal movement were reviewed in detail with the patient. Please refer to After Visit Summary for other counseling recommendations.   No follow-ups on file.  Future Appointments  Date Time Provider Department Center  05/22/2020  8:45 AM [facility] NURSE [facility]  05/22/2020 10:00 AM [facility] US1 [facility]  06/06/2020  8:30 AM Brand Males, CNM [facility]    Elsie Lincoln, MD

## 2020-05-22 ENCOUNTER — Encounter: Payer: Self-pay | Admitting: *Deleted

## 2020-05-22 ENCOUNTER — Ambulatory Visit: Payer: Self-pay | Attending: Obstetrics

## 2020-05-22 ENCOUNTER — Ambulatory Visit: Payer: Self-pay | Admitting: *Deleted

## 2020-05-22 ENCOUNTER — Encounter: Payer: Self-pay | Admitting: Family Medicine

## 2020-05-22 DIAGNOSIS — O133 Gestational [pregnancy-induced] hypertension without significant proteinuria, third trimester: Secondary | ICD-10-CM

## 2020-05-22 DIAGNOSIS — Z3480 Encounter for supervision of other normal pregnancy, unspecified trimester: Secondary | ICD-10-CM

## 2020-05-22 DIAGNOSIS — O169 Unspecified maternal hypertension, unspecified trimester: Secondary | ICD-10-CM | POA: Insufficient documentation

## 2020-05-22 DIAGNOSIS — Z8759 Personal history of other complications of pregnancy, childbirth and the puerperium: Secondary | ICD-10-CM | POA: Insufficient documentation

## 2020-05-22 DIAGNOSIS — O99210 Obesity complicating pregnancy, unspecified trimester: Secondary | ICD-10-CM | POA: Insufficient documentation

## 2020-05-22 DIAGNOSIS — Z3A34 34 weeks gestation of pregnancy: Secondary | ICD-10-CM

## 2020-05-22 DIAGNOSIS — E669 Obesity, unspecified: Secondary | ICD-10-CM

## 2020-05-22 DIAGNOSIS — O09299 Supervision of pregnancy with other poor reproductive or obstetric history, unspecified trimester: Secondary | ICD-10-CM | POA: Insufficient documentation

## 2020-05-22 DIAGNOSIS — O09293 Supervision of pregnancy with other poor reproductive or obstetric history, third trimester: Secondary | ICD-10-CM

## 2020-05-22 DIAGNOSIS — O99213 Obesity complicating pregnancy, third trimester: Secondary | ICD-10-CM | POA: Insufficient documentation

## 2020-05-22 DIAGNOSIS — O09523 Supervision of elderly multigravida, third trimester: Secondary | ICD-10-CM | POA: Insufficient documentation

## 2020-05-22 DIAGNOSIS — Z362 Encounter for other antenatal screening follow-up: Secondary | ICD-10-CM

## 2020-05-22 NOTE — Progress Notes (Signed)
Talked to [employer]. Patient logged an elevated BP.   Blood pressure 134/97, no symptoms.

## 2020-05-23 ENCOUNTER — Telehealth: Payer: Self-pay | Admitting: *Deleted

## 2020-05-23 NOTE — Telephone Encounter (Signed)
-----   Message from Lesly Dukes, MD sent at 05/23/2020  7:49 AM EDT ----- Regarding: FW: Gestational Hypertension-NST Please schedule Morrie Sheldon for weekly NSTs until she is 37 weeks (when her induction should be scheduled). Thanks, [facility] ----- Message ----- From: Noralee Space, MD Sent: 05/22/2020  11:19 AM EDT To: Lesly Dukes, MD Subject: Gestational Hypertension-NST                   Patient has a diagnosis of gestational hypertension. Normal growth and BPP on today's ultrasound. I recommended weekly NST till delivery (37 weeks) at your office. Can you arrange for her NST appts please? Thanks [employer]

## 2020-05-23 NOTE — Telephone Encounter (Signed)
Left patient an urgent message to call the office to schedule weekly NSTs, but they need to be twice weekly, Monday/Thursday or Tuesday/Friday.

## 2020-05-26 ENCOUNTER — Other Ambulatory Visit: Payer: Self-pay | Admitting: Obstetrics & Gynecology

## 2020-05-26 DIAGNOSIS — O169 Unspecified maternal hypertension, unspecified trimester: Secondary | ICD-10-CM

## 2020-05-27 ENCOUNTER — Ambulatory Visit (INDEPENDENT_AMBULATORY_CARE_PROVIDER_SITE_OTHER): Payer: Self-pay | Admitting: *Deleted

## 2020-05-27 ENCOUNTER — Other Ambulatory Visit: Payer: Self-pay

## 2020-05-27 DIAGNOSIS — Z3480 Encounter for supervision of other normal pregnancy, unspecified trimester: Secondary | ICD-10-CM

## 2020-05-27 DIAGNOSIS — O133 Gestational [pregnancy-induced] hypertension without significant proteinuria, third trimester: Secondary | ICD-10-CM

## 2020-05-27 DIAGNOSIS — O139 Gestational [pregnancy-induced] hypertension without significant proteinuria, unspecified trimester: Secondary | ICD-10-CM

## 2020-05-27 DIAGNOSIS — Z8759 Personal history of other complications of pregnancy, childbirth and the puerperium: Secondary | ICD-10-CM

## 2020-05-27 DIAGNOSIS — O99210 Obesity complicating pregnancy, unspecified trimester: Secondary | ICD-10-CM

## 2020-05-27 DIAGNOSIS — Z3A34 34 weeks gestation of pregnancy: Secondary | ICD-10-CM

## 2020-05-27 DIAGNOSIS — O09529 Supervision of elderly multigravida, unspecified trimester: Secondary | ICD-10-CM

## 2020-05-27 NOTE — Progress Notes (Signed)
Pt here for NST-Reactive and AFI today.  Both are WNL and reactive.

## 2020-05-29 ENCOUNTER — Telehealth (HOSPITAL_COMMUNITY): Payer: Self-pay | Admitting: *Deleted

## 2020-05-29 NOTE — Telephone Encounter (Signed)
Preadmission screen  

## 2020-05-30 ENCOUNTER — Encounter (HOSPITAL_COMMUNITY): Payer: Self-pay | Admitting: Obstetrics and Gynecology

## 2020-05-30 ENCOUNTER — Inpatient Hospital Stay (HOSPITAL_COMMUNITY)
Admission: AD | Admit: 2020-05-30 | Discharge: 2020-05-30 | Disposition: A | Discharge status: Home / Self Care | Payer: Self-pay | Attending: Obstetrics and Gynecology | Admitting: Obstetrics and Gynecology

## 2020-05-30 ENCOUNTER — Other Ambulatory Visit: Payer: Self-pay

## 2020-05-30 ENCOUNTER — Telehealth (HOSPITAL_COMMUNITY): Payer: Self-pay | Admitting: *Deleted

## 2020-05-30 ENCOUNTER — Ambulatory Visit (INDEPENDENT_AMBULATORY_CARE_PROVIDER_SITE_OTHER): Payer: Self-pay

## 2020-05-30 DIAGNOSIS — O09529 Supervision of elderly multigravida, unspecified trimester: Secondary | ICD-10-CM

## 2020-05-30 DIAGNOSIS — O139 Gestational [pregnancy-induced] hypertension without significant proteinuria, unspecified trimester: Secondary | ICD-10-CM

## 2020-05-30 DIAGNOSIS — Z8759 Personal history of other complications of pregnancy, childbirth and the puerperium: Secondary | ICD-10-CM

## 2020-05-30 DIAGNOSIS — Z3480 Encounter for supervision of other normal pregnancy, unspecified trimester: Secondary | ICD-10-CM

## 2020-05-30 DIAGNOSIS — O99210 Obesity complicating pregnancy, unspecified trimester: Secondary | ICD-10-CM

## 2020-05-30 DIAGNOSIS — Z3A35 35 weeks gestation of pregnancy: Secondary | ICD-10-CM

## 2020-05-30 DIAGNOSIS — O133 Gestational [pregnancy-induced] hypertension without significant proteinuria, third trimester: Secondary | ICD-10-CM

## 2020-05-30 HISTORY — DX: Gestational (pregnancy-induced) hypertension without significant proteinuria, unspecified trimester: O13.9

## 2020-05-30 LAB — COMPREHENSIVE METABOLIC PANEL
ALT: 13 U/L (ref 0–44)
AST: 14 U/L — ABNORMAL LOW (ref 15–41)
Albumin: 2.5 g/dL — ABNORMAL LOW (ref 3.5–5.0)
Alkaline Phosphatase: 107 U/L (ref 38–126)
Anion gap: 7 (ref 5–15)
BUN: 8 mg/dL (ref 6–20)
CO2: 22 mmol/L (ref 22–32)
Calcium: 8.7 mg/dL — ABNORMAL LOW (ref 8.9–10.3)
Chloride: 105 mmol/L (ref 98–111)
Creatinine, Ser: 0.55 mg/dL (ref 0.44–1.00)
GFR, Estimated: >60 mL/min (ref >60)
Glucose, Bld: 85 mg/dL (ref 70–99)
Potassium: 3.7 mmol/L (ref 3.5–5.1)
Sodium: 134 mmol/L — ABNORMAL LOW (ref 135–145)
Total Bilirubin: 0.5 mg/dL (ref 0.3–1.2)
Total Protein: 5.9 g/dL — ABNORMAL LOW (ref 6.5–8.1)

## 2020-05-30 LAB — PROTEIN / CREATININE RATIO, URINE
Creatinine, Urine: 91.05 mg/dL
Protein Creatinine Ratio: 0.09 mg/mg{Cre} (ref 0.00–0.15)
Total Protein, Urine: 8 mg/dL

## 2020-05-30 LAB — CBC
HCT: 32.5 % — ABNORMAL LOW (ref 36.0–46.0)
Hemoglobin: 10.4 g/dL — ABNORMAL LOW (ref 12.0–15.0)
MCH: 27.8 pg (ref 26.0–34.0)
MCHC: 32 g/dL (ref 30.0–36.0)
MCV: 86.9 fL (ref 80.0–100.0)
Platelets: 255 10*3/uL (ref 150–400)
RBC: 3.74 MIL/uL — ABNORMAL LOW (ref 3.87–5.11)
RDW: 13.3 % (ref 11.5–15.5)
WBC: 11.8 10*3/uL — ABNORMAL HIGH (ref 4.0–10.5)
nRBC: 0 % (ref 0.0–0.2)

## 2020-05-30 MED ORDER — ACETAMINOPHEN 500 MG PO TABS
1000.0000 mg | ORAL_TABLET | Freq: Once | ORAL | Status: AC
  Administered 2020-05-30: 1000 mg via ORAL
  Filled 2020-05-30: qty 2

## 2020-05-30 NOTE — Discharge Instructions (Signed)
Fetal Movement Counts Patient Name: ________________________________________________ Patient Due Date: ____________________  What is a fetal movement count? A fetal movement count is the number of times that you feel your baby move during a certain amount of time. This may also be called a fetal kick count. A fetal movement count is recommended for every pregnant woman. You may be asked to start counting fetal movements as early as week 28 of your pregnancy. Pay attention to when your baby is most active. You may notice your baby's sleep and wake cycles. You may also notice things that make your baby move more. You should do a fetal movement count:  When your baby is normally most active.  At the same time each day. A good time to count movements is while you are resting, after having something to eat and drink. How do I count fetal movements? 1. Find a quiet, comfortable area. Sit, or lie down on your side. 2. Write down the date, the start time and stop time, and the number of movements that you felt between those two times. Take this information with you to your health care visits. 3. Write down your start time when you feel the first movement. 4. Count kicks, flutters, swishes, rolls, and jabs. You should feel at least 10 movements. 5. You may stop counting after you have felt 10 movements, or if you have been counting for 2 hours. Write down the stop time. 6. If you do not feel 10 movements in 2 hours, contact your health care provider for further instructions. Your health care provider may want to do additional tests to assess your baby's well-being. Contact a health care provider if:  You feel fewer than 10 movements in 2 hours.  Your baby is not moving like he or she usually does. Date: ____________ Start time: ____________ Stop time: ____________ Movements: ____________ Date: ____________ Start time: ____________ Stop time: ____________ Movements: ____________ Date: ____________  Start time: ____________ Stop time: ____________ Movements: ____________ Date: ____________ Start time: ____________ Stop time: ____________ Movements: ____________ Date: ____________ Start time: ____________ Stop time: ____________ Movements: ____________ Date: ____________ Start time: ____________ Stop time: ____________ Movements: ____________ Date: ____________ Start time: ____________ Stop time: ____________ Movements: ____________ Date: ____________ Start time: ____________ Stop time: ____________ Movements: ____________ Date: ____________ Start time: ____________ Stop time: ____________ Movements: ____________ This information is not intended to replace advice given to you by your health care provider. Make sure you discuss any questions you have with your health care provider. Document Revised: 08/24/2018 Document Reviewed: 08/24/2018 Elsevier Patient Education  2021 Elsevier Inc. Hypertension During Pregnancy High blood pressure (hypertension) is when the force of blood pumping through the arteries is high enough to cause problems with your health. Arteries are blood vessels that carry blood from the heart throughout the body. Hypertension during pregnancy can cause problems for you and your baby. It can be mild or severe. There are different types of hypertension that can happen during pregnancy. These include:  Chronic hypertension. This happens when you had high blood pressure before you became pregnant, and it continues during the pregnancy. Hypertension that develops before you are [redacted] weeks pregnant and continues during the pregnancy is also called chronic hypertension. If you have chronic hypertension, it will not go away after you have your baby. You will need follow-up visits with your health care provider after you have your baby. Your health care provider may want you to keep taking medicine for your blood pressure.  Gestational hypertension. This   is hypertension that develops  after the 20th week of pregnancy. Gestational hypertension usually goes away after you have your baby, but your health care provider will need to monitor your blood pressure to make sure that it is getting better.  Postpartum hypertension. This is high blood pressure that was present before delivery and continues after delivery or that starts after delivery. This usually occurs within 48 hours after childbirth but may occur up to 6 weeks after giving birth. When hypertension during pregnancy is severe, it is a medical emergency that requires treatment right away. How does this affect me? Women who have hypertension during pregnancy have a greater chance of developing hypertension later in life or during future pregnancies. In some cases, hypertension during pregnancy can cause serious complications, such as:  Stroke.  Heart attack.  Injury to other organs, such as kidneys, lungs, or liver.  Preeclampsia.  A condition called hemolysis, elevated liver enzymes, and low platelet count (HELLP) syndrome.  Convulsions or seizures.  Placental abruption. How does this affect my baby? Hypertension during pregnancy can affect your baby. Your baby may:  Be born early (prematurely).  Not weigh as much as he or she should at birth (low birth weight).  Not tolerate labor well, leading to an unplanned cesarean delivery. This condition may also result in a baby's death before birth (stillbirth). What are the risks? There are certain factors that make it more likely for you to develop hypertension during pregnancy. These include:  Having hypertension during a previous pregnancy or a family history of hypertension.  Being overweight.  Being age 35 or older.  Being pregnant for the first time.  Being pregnant with more than one baby.  Becoming pregnant using fertilization methods, such as IVF (in vitro fertilization).  Having other medical problems, such as diabetes, kidney disease, or  lupus. What can I do to lower my risk? The exact cause of hypertension during pregnancy is not known. You may be able to lower your risk by:  Maintaining a healthy weight.  Eating a healthy and balanced diet.  Following your health care provider's instructions about treating any long-term conditions that you had before becoming pregnant. It is very important to keep all of your prenatal care appointments. Your health care provider will check your blood pressure and make sure that your pregnancy is progressing as expected. If a problem is found, early treatment can prevent complications.   How is this treated? Treatment for hypertension during pregnancy varies depending on the type of hypertension you have and how serious it is.  If you were taking medicine for high blood pressure before you became pregnant, talk with your health care provider. You may need to change medicine during pregnancy because some medicines, like ACE inhibitors, may not be considered safe for your baby.  If you have gestational hypertension, your health care provider may order medicine to treat this during pregnancy.  If you are at risk for preeclampsia, your health care provider may recommend that you take a low-dose aspirin during your pregnancy.  If you have severe hypertension, you may need to be hospitalized so you and your baby can be monitored closely. You may also need to be given medicine to lower your blood pressure.  In some cases, if your condition gets worse, you may need to deliver your baby early. Follow these instructions at home: Eating and drinking  Drink enough fluid to keep your urine pale yellow.  Avoid caffeine.   Lifestyle  Do not   use any products that contain nicotine or tobacco. These products include cigarettes, chewing tobacco, and vaping devices, such as e-cigarettes. If you need help quitting, ask your health care provider.  Do not use alcohol or drugs.  Avoid stress as much as  possible.  Rest and get plenty of sleep.  Regular exercise can help to reduce your blood pressure. Ask your health care provider what kinds of exercise are best for you. General instructions  Take over-the-counter and prescription medicines only as told by your health care provider.  Keep all prenatal and follow-up visits. This is important. Contact a health care provider if:  You have symptoms that your health care provider told you may require more treatment or monitoring, such as: ? Headaches. ? Nausea or vomiting. ? Abdominal pain. ? Dizziness. ? Light-headedness. Get help right away if:  You have symptoms of serious complications, such as: ? Severe abdominal pain that does not get better with treatment. ? A severe headache that does not get better, blurred vision, or double vision. ? Vomiting that does not get better. ? Sudden, rapid weight gain or swelling in your hands, ankles, or face. ? Vaginal bleeding. ? Blood in your urine. ? Shortness of breath or chest pain. ? Weakness on one side of your body or difficulty speaking.  Your baby is not moving as much as usual. These symptoms may represent a serious problem that is an emergency. Do not wait to see if the symptoms will go away. Get medical help right away. Call your local emergency services (911 in the [place] Do not drive yourself to the hospital. Summary  Hypertension during pregnancy can cause problems for you and your baby.  Treatment for hypertension during pregnancy varies depending on the type of hypertension you have and how serious it is.  Keep all prenatal and follow-up visits. This is important.  Get help right away if you have symptoms of serious complications related to high blood pressure. This information is not intended to replace advice given to you by your health care provider. Make sure you discuss any questions you have with your health care provider. Document Revised: 09/27/2019 Document  Reviewed: 09/27/2019 Elsevier Patient Education  2021 Elsevier Inc.  

## 2020-05-30 NOTE — Progress Notes (Addendum)
Pt in office today for NST due to gestational hypertension. BP 140/69. Pt states she has had a "slight headache for a few days". Pt denies visual changes. Pt also reports increased swelling. NST reactive and reviewed by Donette Larry, CNM. Pt was told to go to [facility]U per Fabian November. Pt expressed understanding.

## 2020-05-30 NOTE — Telephone Encounter (Signed)
Preadmission screen  

## 2020-05-30 NOTE — [facility]U Note (Signed)
Sent from MD office for BP evaluation and H/A.  Denies visual disturbances and epigastric pain.  Denies LOF and VB.  Endorses +FM.

## 2020-06-02 ENCOUNTER — Telehealth (HOSPITAL_COMMUNITY): Payer: Self-pay | Admitting: *Deleted

## 2020-06-02 ENCOUNTER — Encounter (HOSPITAL_COMMUNITY): Payer: Self-pay | Admitting: *Deleted

## 2020-06-02 NOTE — Telephone Encounter (Signed)
Preadmission screen  

## 2020-06-03 ENCOUNTER — Other Ambulatory Visit: Payer: Self-pay

## 2020-06-03 ENCOUNTER — Ambulatory Visit (INDEPENDENT_AMBULATORY_CARE_PROVIDER_SITE_OTHER): Payer: Self-pay | Admitting: *Deleted

## 2020-06-03 DIAGNOSIS — Z3480 Encounter for supervision of other normal pregnancy, unspecified trimester: Secondary | ICD-10-CM

## 2020-06-03 DIAGNOSIS — O09529 Supervision of elderly multigravida, unspecified trimester: Secondary | ICD-10-CM

## 2020-06-03 DIAGNOSIS — O99210 Obesity complicating pregnancy, unspecified trimester: Secondary | ICD-10-CM

## 2020-06-03 DIAGNOSIS — O133 Gestational [pregnancy-induced] hypertension without significant proteinuria, third trimester: Secondary | ICD-10-CM

## 2020-06-03 DIAGNOSIS — Z3A35 35 weeks gestation of pregnancy: Secondary | ICD-10-CM

## 2020-06-03 DIAGNOSIS — Z8759 Personal history of other complications of pregnancy, childbirth and the puerperium: Secondary | ICD-10-CM

## 2020-06-03 NOTE — Progress Notes (Signed)
NST is reactive.

## 2020-06-04 ENCOUNTER — Other Ambulatory Visit: Payer: Self-pay | Admitting: Advanced Practice Midwife

## 2020-06-06 ENCOUNTER — Ambulatory Visit (INDEPENDENT_AMBULATORY_CARE_PROVIDER_SITE_OTHER): Payer: Self-pay

## 2020-06-06 ENCOUNTER — Other Ambulatory Visit: Payer: Self-pay

## 2020-06-06 VITALS — BP 137/65 | HR 82 | Wt 255.0 lb

## 2020-06-06 DIAGNOSIS — Z3A36 36 weeks gestation of pregnancy: Secondary | ICD-10-CM

## 2020-06-06 DIAGNOSIS — Z3480 Encounter for supervision of other normal pregnancy, unspecified trimester: Secondary | ICD-10-CM

## 2020-06-06 DIAGNOSIS — O133 Gestational [pregnancy-induced] hypertension without significant proteinuria, third trimester: Secondary | ICD-10-CM

## 2020-06-06 LAB — OB RESULTS CONSOLE GC/CHLAMYDIA
Chlamydia: NEGATIVE
Gonorrhea: NEGATIVE

## 2020-06-06 LAB — OB RESULTS CONSOLE GBS: GBS: POSITIVE

## 2020-06-06 NOTE — Progress Notes (Signed)
   PRENATAL VISIT NOTE  Subjective:  Alice Waters is a 35 y.o. [phone] at [redacted]w[redacted]d being seen today for ongoing prenatal care. She is currently monitored for the following issues for this high-risk pregnancy and has PCOS (polycystic ovarian syndrome); Supervision of other normal pregnancy, antepartum; Advanced maternal age in multigravida; History of delivery of macrosomal infant; Obesity in pregnancy; and Gestational hypertension without significant proteinuria on their problem list.  Patient reports no complaints.  Contractions: Not present. Vag. Bleeding: None.  Movement: Present. Denies leaking of fluid.   The following portions of the patient's history were reviewed and updated as appropriate: allergies, current medications, past family history, past medical history, past social history, past surgical history and problem list.   Objective:   Vitals:   06/06/20 0830  BP: 137/65  Pulse: 82  Weight: 255 lb (115.7 kg)    Fetal Status: Fetal Heart Rate (bpm): NST-R   Movement: Present  Presentation: Vertex  General:  Alert, oriented and cooperative. Patient is in no acute distress.  Skin: Skin is warm and dry. No rash noted.   Cardiovascular: Normal heart rate noted  Respiratory: Normal respiratory effort, no problems with respiration noted  Abdomen: Soft, gravid, appropriate for gestational age.  Pain/Pressure: Present     Pelvic: Cervical exam performed in the presence of a chaperone Dilation: 1 Effacement (%): Thick Station: Ballotable  Extremities: Normal range of motion.  Edema: Mild pitting, slight indentation  Mental Status: Normal mood and affect. Normal behavior. Normal judgment and thought content.   Assessment and Plan:  Pregnancy: G4P3003 at [redacted]w[redacted]d 1. [redacted] weeks gestation of pregnancy - Doing well. No complaints  - Culture, Grp B Strep w/Rflx Suscept - Cervicovaginal ancillary only( [facility])  2. Supervision of other normal pregnancy, antepartum   3. Gestational  hypertension without significant proteinuria in third trimester - BP stable today - Patient asymptomatic - Reviewed pre-e signs/symptoms - IOL scheduled 5/26  Preterm labor symptoms and general obstetric precautions including but not limited to vaginal bleeding, contractions, leaking of fluid and fetal movement were reviewed in detail with the patient. Please refer to After Visit Summary for other counseling recommendations.   No follow-ups on file.  Future Appointments  Date Time Provider Department Center  06/10/2020  9:45 AM [facility]-SCREENING [facility] None  06/12/2020  7:45 AM [facility]-LD SCHED ROOM [facility] None    Brand Males, CNM 06/06/20 10:06 AM

## 2020-06-06 NOTE — Progress Notes (Signed)
NST is reactive AFI-19.02

## 2020-06-09 ENCOUNTER — Encounter (HOSPITAL_COMMUNITY): Payer: Self-pay | Admitting: Obstetrics & Gynecology

## 2020-06-09 ENCOUNTER — Inpatient Hospital Stay (HOSPITAL_COMMUNITY)
Admission: RE | Admit: 2020-06-09 | Discharge: 2020-06-10 | Disposition: A | Discharge status: Home / Self Care | Payer: Self-pay | Attending: Obstetrics & Gynecology | Admitting: Obstetrics & Gynecology

## 2020-06-09 ENCOUNTER — Other Ambulatory Visit: Payer: Self-pay

## 2020-06-09 DIAGNOSIS — Z8759 Personal history of other complications of pregnancy, childbirth and the puerperium: Secondary | ICD-10-CM

## 2020-06-09 DIAGNOSIS — O99013 Anemia complicating pregnancy, third trimester: Secondary | ICD-10-CM | POA: Insufficient documentation

## 2020-06-09 DIAGNOSIS — Z7982 Long term (current) use of aspirin: Secondary | ICD-10-CM | POA: Insufficient documentation

## 2020-06-09 DIAGNOSIS — Z3480 Encounter for supervision of other normal pregnancy, unspecified trimester: Secondary | ICD-10-CM

## 2020-06-09 DIAGNOSIS — O09523 Supervision of elderly multigravida, third trimester: Secondary | ICD-10-CM | POA: Insufficient documentation

## 2020-06-09 DIAGNOSIS — O99213 Obesity complicating pregnancy, third trimester: Secondary | ICD-10-CM | POA: Insufficient documentation

## 2020-06-09 DIAGNOSIS — O09529 Supervision of elderly multigravida, unspecified trimester: Secondary | ICD-10-CM

## 2020-06-09 DIAGNOSIS — O99210 Obesity complicating pregnancy, unspecified trimester: Secondary | ICD-10-CM

## 2020-06-09 DIAGNOSIS — O133 Gestational [pregnancy-induced] hypertension without significant proteinuria, third trimester: Secondary | ICD-10-CM | POA: Insufficient documentation

## 2020-06-09 DIAGNOSIS — Z3A36 36 weeks gestation of pregnancy: Secondary | ICD-10-CM | POA: Insufficient documentation

## 2020-06-09 LAB — PROTEIN / CREATININE RATIO, URINE
Creatinine, Urine: 201.92 mg/dL
Protein Creatinine Ratio: 0.08 mg/mg{Cre} (ref 0.00–0.15)
Total Protein, Urine: 16 mg/dL

## 2020-06-09 LAB — COMPREHENSIVE METABOLIC PANEL
ALT: 12 U/L (ref 0–44)
AST: 15 U/L (ref 15–41)
Albumin: 2.5 g/dL — ABNORMAL LOW (ref 3.5–5.0)
Alkaline Phosphatase: 111 U/L (ref 38–126)
Anion gap: 7 (ref 5–15)
BUN: 12 mg/dL (ref 6–20)
CO2: 20 mmol/L — ABNORMAL LOW (ref 22–32)
Calcium: 8.8 mg/dL — ABNORMAL LOW (ref 8.9–10.3)
Chloride: 107 mmol/L (ref 98–111)
Creatinine, Ser: 0.6 mg/dL (ref 0.44–1.00)
GFR, Estimated: >60 mL/min (ref >60)
Glucose, Bld: 134 mg/dL — ABNORMAL HIGH (ref 70–99)
Potassium: 3.9 mmol/L (ref 3.5–5.1)
Sodium: 134 mmol/L — ABNORMAL LOW (ref 135–145)
Total Bilirubin: 0.6 mg/dL (ref 0.3–1.2)
Total Protein: 6 g/dL — ABNORMAL LOW (ref 6.5–8.1)

## 2020-06-09 LAB — CERVICOVAGINAL ANCILLARY ONLY
Chlamydia: NEGATIVE
Comment: NEGATIVE
Comment: NORMAL
Neisseria Gonorrhea: NEGATIVE

## 2020-06-09 LAB — CBC
HCT: 33.8 % — ABNORMAL LOW (ref 36.0–46.0)
Hemoglobin: 11.1 g/dL — ABNORMAL LOW (ref 12.0–15.0)
MCH: 27.7 pg (ref 26.0–34.0)
MCHC: 32.8 g/dL (ref 30.0–36.0)
MCV: 84.3 fL (ref 80.0–100.0)
Platelets: 271 10*3/uL (ref 150–400)
RBC: 4.01 MIL/uL (ref 3.87–5.11)
RDW: 13.5 % (ref 11.5–15.5)
WBC: 12.6 10*3/uL — ABNORMAL HIGH (ref 4.0–10.5)
nRBC: 0 % (ref 0.0–0.2)

## 2020-06-09 NOTE — MAU Provider Note (Signed)
Chief Complaint: Elevated Blood Pressure  HPI: Alice Waters is a 35 y.o. G4P3003 at [redacted]w[redacted]d by L/9 who presents to maternity admissions reporting concern of elevated blood pressure. Specifically pt reports elevated blood pressures at home (max 169/109). She reports good fetal movement. She endorses worsening nausea since 06/07/20 in addition to mild headache now resolved. No medications taken for headache prior to arrival. Denies LOF, vaginal bleeding, vaginal itching/burning, urinary symptoms, h/a, dizziness, n/v, or fever/chills.    Past Medical History: Past Medical History:  Diagnosis Date  . Gestational hypertension   . History of PCOS   . Hypertension     Past obstetric history: OB History  Gravida Para Term Preterm AB Living  SAB IAB Ectopic Multiple Live Births          3    # Outcome Date GA Lbr Len/2nd Weight Sex Delivery Anes PTL Lv  4 Current           3 Term 05/23/13 [redacted]w[redacted]d  4600 g M Vag-Spont   LIV     Birth Comments: IOL, no SD  2 Term 08/26/10 [redacted]w[redacted]d  4423 g F Vag-Spont   LIV     Birth Comments: poly, IOL  1 Term 09/24/07 [redacted]w[redacted]d  3941 g F Vag-Spont   LIV     Birth Comments: poly, IOL, chorio    Past Surgical History: Past Surgical History:  Procedure Laterality Date  . WISDOM TOOTH EXTRACTION      Family History: Family History  Problem Relation Age of Onset  . Thyroid disease Mother   . Hypertension Father   . COPD Father   . Congestive Heart Failure Father   . Breast cancer Paternal Grandmother     Social History: Social History   Tobacco Use  . Smoking status: Never Smoker  . Smokeless tobacco: Never Used  Vaping Use  . Vaping Use: Never used  Substance Use Topics  . Alcohol use: Never  . Drug use: Never    Allergies:  Allergies  Allergen Reactions  . Penicillins Anaphylaxis, Hives and Rash    Meds:  Medications Prior to Admission  Medication Sig Dispense Refill Last Dose  . aspirin EC 81 MG tablet Take 1 tablet (81 mg  total) by mouth daily. Swallow whole. 60 tablet 3 06/09/2020 at Unknown time  . Prenatal Vit-Fe Fumarate-FA (PRENATAL VITAMIN PO) Take by mouth.   06/09/2020 at Unknown time    ROS:  Review of Systems  Constitutional: Negative for chills and fever.  HENT: Negative for sore throat.   Eyes: Negative for photophobia and visual disturbance.  Respiratory: Negative for chest tightness and shortness of breath.   Cardiovascular: Negative for chest pain.  Gastrointestinal: Positive for nausea. Negative for abdominal pain, diarrhea and vomiting.  Genitourinary: Negative for dysuria, vaginal bleeding, vaginal discharge and vaginal pain.  Musculoskeletal: Negative for myalgias.  Neurological: Negative for seizures, light-headedness and headaches.   I have reviewed patient's Past Medical Hx, Surgical Hx, Family Hx, Social Hx, medications and allergies.   Physical Exam   Patient Vitals for the past 24 hrs:  BP Temp Temp src Pulse Resp SpO2 Height Weight  06/10/20 0031 130/82 -- -- 94 -- -- -- --  06/10/20 0016 132/65 -- -- 96 -- -- -- --  06/10/20 0015 -- -- -- -- -- 97 % -- --  06/10/20 0001 125/70 -- -- 97 -- -- -- --  06/10/20 0000 -- -- -- -- --  97 % -- --  06/09/20 2346 134/77 -- -- 96 -- -- -- --  06/09/20 2345 -- -- -- -- -- 97 % -- --  06/09/20 2331 (!) 134/94 -- -- (!) 104 -- -- -- --  06/09/20 2330 -- -- -- -- -- 97 % -- --  06/09/20 2316 (!) 146/76 -- -- (!) 110 -- -- -- --  06/09/20 2315 -- -- -- -- -- 97 % -- --  06/09/20 2305 -- -- -- -- -- 98 % -- --  06/09/20 2300 119/79 -- -- (!) 117 -- 96 % -- --  06/09/20 2246 125/81 -- -- (!) 112 -- -- -- --  06/09/20 2245 -- -- -- -- -- 97 % -- --  06/09/20 2231 132/74 -- -- 95 -- -- -- --  06/09/20 2230 -- -- -- -- -- 96 % -- --  06/09/20 2206 (!) 143/92 97.9 F (36.6 C) Oral (!) 112 18 -- 5\' 7"  (1.702 m) 117.2 kg   Constitutional: Well-developed, well-nourished female in no acute distress.  Cardiovascular: normal rate Respiratory:  normal effort GI: Abd soft, non-tender, gravid appropriate for gestational age.  MS: Extremities nontender, no edema, normal ROM Neurologic: Alert and oriented x 4.  GU: deferred  Labs: Results for orders placed or performed during the hospital encounter of 06/09/20 (from the past 24 hour(s))  Protein / creatinine ratio, urine     Status: None   Collection Time: 06/09/20 10:14 PM  Result Value Ref Range   Creatinine, Urine 201.92 mg/dL   Total Protein, Urine 16 mg/dL   Protein Creatinine Ratio 0.08 0.00 - 0.15 mg/mg[Cre]  Comprehensive metabolic panel     Status: Abnormal   Collection Time: 06/09/20 11:12 PM  Result Value Ref Range   Sodium 134 (L) 135 - 145 mmol/L   Potassium 3.9 3.5 - 5.1 mmol/L   Chloride 107 98 - 111 mmol/L   CO2 20 (L) 22 - 32 mmol/L   Glucose, Bld 134 (H) 70 - 99 mg/dL   BUN 12 6 - 20 mg/dL   Creatinine, Ser 1.61 0.44 - 1.00 mg/dL   Calcium 8.8 (L) 8.9 - 10.3 mg/dL   Total Protein 6.0 (L) 6.5 - 8.1 g/dL   Albumin 2.5 (L) 3.5 - 5.0 g/dL   AST 15 15 - 41 U/L   ALT 12 0 - 44 U/L   Alkaline Phosphatase 111 38 - 126 U/L   Total Bilirubin 0.6 0.3 - 1.2 mg/dL   GFR, Estimated >09 >60 mL/min   Anion gap 7 5 - 15  CBC     Status: Abnormal   Collection Time: 06/09/20 11:12 PM  Result Value Ref Range   WBC 12.6 (H) 4.0 - 10.5 K/uL   RBC 4.01 3.87 - 5.11 MIL/uL   Hemoglobin 11.1 (L) 12.0 - 15.0 g/dL   HCT 45.4 (L) 09.8 - 11.9 %   MCV 84.3 80.0 - 100.0 fL   MCH 27.7 26.0 - 34.0 pg   MCHC 32.8 30.0 - 36.0 g/dL   RDW 14.7 82.9 - 56.2 %   Platelets 271 150 - 400 K/uL   nRBC 0.0 0.0 - 0.2 %   O/RH(D) POSITIVE/-- (11/12 1044)  Imaging:  [place] MFM FETAL BPP WO NON STRESS  Result Date: 05/26/2020 ----------------------------------------------------------------------  OBSTETRICS REPORT                    (Corrected Final 05/26/2020 05:27 pm) ---------------------------------------------------------------------- Patient Info  ID #:  [id number]                           D.O.B.:  1985/03/15 (34 yrs)  Name:       Alice Waters                  Visit Date: 05/22/2020 10:25 am ---------------------------------------------------------------------- Performed By  Attending:        Noralee Space MD        Ref. Address:     [address] [place]  Performed By:     Birdena Crandall        [facility]                                 [facility]  Referred By:      Everardo All ---------------------------------------------------------------------- Orders  #  Description                           Code        Ordered By  1  [place] MFM OB FOLLOW UP                   [phone]    YU FANG  2  [place] MFM FETAL BPP WO NON               [id number]    KELLY LEGGETT     STRESS ----------------------------------------------------------------------  #  Order #                     Accession #                Episode #  1  [id number]                   [id number]                 [id number]  2  [id number]                   [id number]  [id number] ---------------------------------------------------------------------- Indications  [redacted] weeks gestation of pregnancy                Z3A.34  Gestational hypertension without significant   O13.3  proteinuria, third trimester  Obesity complicating pregnancy, third          O42.213  trimester  Advanced maternal age multigravida 55+,        O8.523  third trimester  Poor obstetric history: Prior fetal            O09.299  macrosomia, antepartum  Encounter for other antenatal screening        Z36.2  follow-up  Declined genetic screening ---------------------------------------------------------------------- Fetal Evaluation  Num Of Fetuses:         1  Fetal Heart Rate(bpm):  143  Cardiac Activity:        Observed  Presentation:           Cephalic  Placenta:               Anterior  P. Cord Insertion:      Previously Visualized  Amniotic Fluid  AFI FV:      Within normal limits  AFI Sum(cm)     %Tile       Largest Pocket(cm)  15.74           57          5.16  RUQ(cm)       RLQ(cm)       LUQ(cm)        LLQ(cm)  4.98          4.32          5.16           1.28 ---------------------------------------------------------------------- Biophysical Evaluation  Amniotic F.V:   Within normal limits       F. Tone:        Observed  F. Movement:    Observed                   Score:          8/8  F. Breathing:   Observed ---------------------------------------------------------------------- Biometry  BPD:      89.5  mm     G. Age:  36w 2d         95  %    CI:        71.99   %    70 - 86                                                          FL/HC:      20.4   %    19.4 - 21.8  HC:      335.7  mm     G. Age:  38w 3d         98  %    HC/AC:      1.04        0.96 - 1.11  AC:      323.7  mm     G. Age:  36w 2d         97  %    FL/BPD:     76.4   %    71 - 87  FL:       68.4  mm  G. Age:  35w 1d         71  %    FL/AC:      21.1   %    20 - 24  Est. FW:    2879  gm      6 lb 6 oz     95  % ---------------------------------------------------------------------- OB History  Gravidity:    4         Term:   3        Prem:   0        SAB:   0  TOP:          0       Ectopic:  0        Living: 3 ---------------------------------------------------------------------- Gestational Age  LMP:           35w 1d        Date:  09/19/19                 EDD:   06/25/20  U/S Today:     36w 4d                                        EDD:   06/15/20  Best:          34w 0d     Det. ByMarcella Dubs         EDD:   07/03/20                                      (11/30/19) ---------------------------------------------------------------------- Anatomy  Cranium:               Appears normal         LVOT:                   Appears normal  Cavum:                  Appears normal         Aortic Arch:            Previously seen  Ventricles:            Appears normal         Ductal Arch:            Previously seen  Choroid Plexus:        Previously seen        Diaphragm:              Appears normal  Cerebellum:            Previously seen        Stomach:                Appears normal, left                                                                        sided  Posterior Fossa:       Previously seen  Abdomen:                Appears normal  Nuchal Fold:           Not applicable (>20    Abdominal Wall:         Previously seen                         wks GA)  Face:                  Orbits and profile     Cord Vessels:           Previously seen                         previously seen  Lips:                  Previously seen        Kidneys:                Appear normal  Palate:                Previously seen        Bladder:                Appears normal  Thoracic:              Appears normal         Spine:                  prev. Limited views                                                                        appear normal  Heart:                 Appears normal         Upper Extremities:      Previously seen                         (4CH, axis, and                         situs)  RVOT:                  Previously seen        Lower Extremities:      Previously seen  Other:  Heels/feet and open hands/5th digits,Nasal bone,  Lenses prev.          visualized. Fetus appears to be female. Technically difficult due to          maternal habitus and fetal position. Normal genaitalia. ---------------------------------------------------------------------- Cervix Uterus Adnexa  Cervix  Not visualized (advanced GA >24wks) ---------------------------------------------------------------------- Impression  Patient has a diagnosis of gestational hypertension.  Fetal growth is appropriate for gestational age.  Amniotic fluid  is normal and good fetal activity seen.  Antenatal testing is   reassuring.  BPP 8/8.  Cephalic presentation.  Blood pressure today at her office is 137/69 mmHg. ---------------------------------------------------------------------- Recommendations  -Weekly NST at your office.  -Delivery at [redacted] weeks  gestation. ----------------------------------------------------------------------                       Noralee Space, MD Electronically Signed Corrected Final Report  05/26/2020 05:27 pm ----------------------------------------------------------------------  [place] MFM OB FOLLOW UP  Result Date: 05/26/2020 ----------------------------------------------------------------------  OBSTETRICS REPORT                    (Corrected Final 05/26/2020 05:27 pm) ---------------------------------------------------------------------- Patient Info  ID #:       [id number]                          D.O.B.:  02/25/1985 (34 yrs)  Name:       Alice Waters                  Visit Date: 05/22/2020 10:25 am ---------------------------------------------------------------------- Performed By  Attending:        Noralee Space MD        Ref. Address:     [address] [place]  Performed By:     Birdena Crandall        Location[facility]                                 [facility]  Referred By:      Everardo All ---------------------------------------------------------------------- Orders  #  Description                           Code        Ordered By  1  [place] MFM OB FOLLOW UP                   76816.01    YU FANG  2  [place] MFM FETAL BPP WO NON               40981.19    KELLY LEGGETT     STRESS ----------------------------------------------------------------------  #  Order #                     Accession #  Episode #  1  [id number]                    [id number]                 [id number]  2  [id number]                   [id number]                 [id number] ---------------------------------------------------------------------- Indications  [redacted] weeks gestation of pregnancy                Z3A.34  Gestational hypertension without significant   O13.3  proteinuria, third trimester  Obesity complicating pregnancy, third          O62.213  trimester  Advanced maternal age multigravida 59+,        O74.523  third trimester  Poor obstetric history: Prior fetal            O09.299  macrosomia, antepartum  Encounter for other antenatal screening        Z36.2  follow-up  Declined genetic screening ---------------------------------------------------------------------- Fetal Evaluation  Num Of Fetuses:         1  Fetal Heart Rate(bpm):  143  Cardiac Activity:       Observed  Presentation:           Cephalic  Placenta:               Anterior  P. Cord Insertion:      Previously Visualized  Amniotic Fluid  AFI FV:      Within normal limits  AFI Sum(cm)     %Tile       Largest Pocket(cm)  15.74           57          5.16  RUQ(cm)       RLQ(cm)       LUQ(cm)        LLQ(cm)  4.98          4.32          5.16           1.28 ---------------------------------------------------------------------- Biophysical Evaluation  Amniotic F.V:   Within normal limits       F. Tone:        Observed  F. Movement:    Observed                   Score:          8/8  F. Breathing:   Observed ---------------------------------------------------------------------- Biometry  BPD:      89.5  mm     G. Age:  36w 2d         95  %    CI:        71.99   %    70 - 86                                                          FL/HC:      20.4   %    19.4 - 21.8  HC:      335.7  mm     G. Age:  38w 3d         98  %  HC/AC:      1.04        0.96 - 1.11  AC:      323.7  mm     G. Age:  36w 2d         97  %    FL/BPD:     76.4   %    71 - 87  FL:       68.4  mm     G. Age:  35w 1d         71  %    FL/AC:      21.1   %     20 - 24  Est. FW:    2879  gm      6 lb 6 oz     95  % ---------------------------------------------------------------------- OB History  Gravidity:    4         Term:   3        Prem:   0        SAB:   0  TOP:          0       Ectopic:  0        Living: 3 ---------------------------------------------------------------------- Gestational Age  LMP:           35w 1d        Date:  09/19/19                 EDD:   06/25/20  U/S Today:     36w 4d                                        EDD:   06/15/20  Best:          34w 0d     Det. By:  Marcella Dubs         EDD:   07/03/20                                      (11/30/19) ---------------------------------------------------------------------- Anatomy  Cranium:               Appears normal         LVOT:                   Appears normal  Cavum:                 Appears normal         Aortic Arch:            Previously seen  Ventricles:            Appears normal         Ductal Arch:            Previously seen  Choroid Plexus:        Previously seen        Diaphragm:              Appears normal  Cerebellum:            Previously seen        Stomach:                Appears normal, left  sided  Posterior Fossa:       Previously seen        Abdomen:                Appears normal  Nuchal Fold:           Not applicable (>20    Abdominal Wall:         Previously seen                         wks GA)  Face:                  Orbits and profile     Cord Vessels:           Previously seen                         previously seen  Lips:                  Previously seen        Kidneys:                Appear normal  Palate:                Previously seen        Bladder:                Appears normal  Thoracic:              Appears normal         Spine:                  prev. Limited views                                                                        appear normal  Heart:                 Appears normal          Upper Extremities:      Previously seen                         (4CH, axis, and                         situs)  RVOT:                  Previously seen        Lower Extremities:      Previously seen  Other:  Heels/feet and open hands/5th digits,Nasal bone,  Lenses prev.          visualized. Fetus appears to be female. Technically difficult due to          maternal habitus and fetal position. Normal genaitalia. ---------------------------------------------------------------------- Cervix Uterus Adnexa  Cervix  Not visualized (advanced GA >24wks) ---------------------------------------------------------------------- Impression  Patient has a diagnosis of gestational hypertension.  Fetal growth is appropriate for gestational age.  Amniotic fluid  is normal and good fetal activity seen.  Antenatal testing is  reassuring.  BPP 8/8.  Cephalic presentation.  Blood pressure today  at her office is 137/69 mmHg. ---------------------------------------------------------------------- Recommendations  -Weekly NST at your office.  -Delivery at [redacted] weeks gestation. ----------------------------------------------------------------------                       Noralee Space, MD Electronically Signed Corrected Final Report  05/26/2020 05:27 pm ----------------------------------------------------------------------   MAU Course/MDM: Orders Placed This Encounter  Procedures  . Comprehensive metabolic panel  . CBC  . Protein / creatinine ratio, urine  . Discharge patient Discharge disposition: 01-Home or Self Care; Discharge patient date: 06/10/2020    No orders of the defined types were placed in this encounter.    NST reviewed and reactive. Treatments in MAU included: none.   Pt discharge with strict return precautions for preterm labor, preeclampsia, or worsening blood pressures.  Assessment & Plan: Alice Waters is a 35 y.o. G4P3003 at [redacted]w[redacted]d by L/9 who presents to maternity admissions reporting concern of elevated blood  pressures at home in the setting of previously diagnosed gHTN. Reassuringly, pt asymptomatic. Preeclampsia wnl except for mild anemia. No signs of labor. Provided strict return precautions for preterm labor and preeclampsia symptoms as noted above 1. Gestational hypertension, third trimester   2. Gestational hypertension without significant proteinuria in third trimester   3. Supervision of other normal pregnancy, antepartum   4. Antepartum multigravida of advanced maternal age   30. History of delivery of macrosomal infant   6. Obesity in pregnancy    Discharge home. Plan for IOL on 06/12/20 for IOL. Labor precautions and fetal kick counts.  Allergies as of 06/10/2020      Reactions   Penicillins Anaphylaxis, Hives, Rash      Medication List    TAKE these medications   aspirin EC 81 MG tablet Take 1 tablet (81 mg total) by mouth daily. Swallow whole.   PRENATAL VITAMIN PO Take by mouth.      Sheila Oats, MD OB Fellow, [facility] 06/10/2020 12:46 AM

## 2020-06-09 NOTE — MAU Note (Signed)
PT SAYS SHE HAS HIGH BP-TAKES BABY ASA ONLY  FELT NAUSEA ON SAT-SUN- WORSE TODAY. BP 154/92- AT 6PM. 158/99 - AT 644PM. 169/109- AT 823PM.  PNC WITH [facility]. ALSO FEELS SOME SOB AND HAD SLIGHT H/A EARLIER- NONE NOW.

## 2020-06-10 ENCOUNTER — Other Ambulatory Visit (HOSPITAL_COMMUNITY)
Admission: RE | Admit: 2020-06-10 | Discharge: 2020-06-10 | Disposition: A | Discharge status: Home / Self Care | Payer: Self-pay | Source: Ambulatory Visit | Attending: Obstetrics and Gynecology | Admitting: Obstetrics and Gynecology

## 2020-06-10 ENCOUNTER — Other Ambulatory Visit: Payer: Self-pay

## 2020-06-10 DIAGNOSIS — Z01812 Encounter for preprocedural laboratory examination: Secondary | ICD-10-CM | POA: Insufficient documentation

## 2020-06-10 DIAGNOSIS — Z3A36 36 weeks gestation of pregnancy: Secondary | ICD-10-CM

## 2020-06-10 DIAGNOSIS — Z20822 Contact with and (suspected) exposure to covid-19: Secondary | ICD-10-CM | POA: Insufficient documentation

## 2020-06-10 DIAGNOSIS — O133 Gestational [pregnancy-induced] hypertension without significant proteinuria, third trimester: Secondary | ICD-10-CM

## 2020-06-10 LAB — SARS CORONAVIRUS 2 (TAT 6-24 HRS): SARS Coronavirus 2: NEGATIVE

## 2020-06-10 NOTE — Discharge Instructions (Signed)
https://www.nichd.nih.gov/health/topics/labor-delivery/Pages/default.aspx">  Third Trimester of Pregnancy  The third trimester of pregnancy is from week 28 through week 40. This is months 7 through 9. The third trimester is a time when the unborn baby (fetus) is growing rapidly. At the end of the ninth month, the fetus is about 20 inches long and weighs 6-10 pounds. Body changes during your third trimester During the third trimester, your body will continue to go through many changes. The changes vary and generally return to normal after your baby is born. Physical changes  Your weight will continue to increase. You can expect to gain 25-35 pounds (11-16 kg) by the end of the pregnancy if you begin pregnancy at a normal weight. If you are underweight, you can expect to gain 28-40 lb (about 13-18 kg), and if you are overweight, you can expect to gain 15-25 lb (about 7-11 kg).  You may begin to get stretch marks on your hips, abdomen, and breasts.  Your breasts will continue to grow and may hurt. A yellow fluid (colostrum) may leak from your breasts. This is the first milk you are producing for your baby.  You may have changes in your hair. These can include thickening of your hair, rapid growth, and changes in texture. Some people also have hair loss during or after pregnancy, or hair that feels dry or thin.  Your belly button may stick out.  You may notice more swelling in your hands, face, or ankles. Health changes  You may have heartburn.  You may have constipation.  You may develop hemorrhoids.  You may develop swollen, bulging veins (varicose veins) in your legs.  You may have increased body aches in the pelvis, back, or thighs. This is due to weight gain and increased hormones that are relaxing your joints.  You may have increased tingling or numbness in your hands, arms, and legs. The skin on your abdomen may also feel numb.  You may feel short of breath because of your  expanding uterus. Other changes  You may urinate more often because the fetus is moving lower into your pelvis and pressing on your bladder.  You may have more problems sleeping. This may be caused by the size of your abdomen, an increased need to urinate, and an increase in your body's metabolism.  You may notice the fetus "dropping," or moving lower in your abdomen (lightening).  You may have increased vaginal discharge.  You may notice that you have pain around your pelvic bone as your uterus distends. Follow these instructions at home: Medicines  Follow your health care provider's instructions regarding medicine use. Specific medicines may be either safe or unsafe to take during pregnancy. Do not take any medicines unless approved by your health care provider.  Take a prenatal vitamin that contains at least 600 micrograms (mcg) of folic acid. Eating and drinking  Eat a healthy diet that includes fresh fruits and vegetables, whole grains, good sources of protein such as meat, eggs, or tofu, and low-fat dairy products.  Avoid raw meat and unpasteurized juice, milk, and cheese. These carry germs that can harm you and your baby.  Eat 4 or 5 small meals rather than 3 large meals a day.  You may need to take these actions to prevent or treat constipation: ? Drink enough fluid to keep your urine pale yellow. ? Eat foods that are high in fiber, such as beans, whole grains, and fresh fruits and vegetables. ? Limit foods that are high in fat and   processed sugars, such as fried or sweet foods. Activity  Exercise only as directed by your health care provider. Most people can continue their usual exercise routine during pregnancy. Try to exercise for 30 minutes at least 5 days a week. Stop exercising if you experience contractions in the uterus.  Stop exercising if you develop pain or cramping in the lower abdomen or lower back.  Avoid heavy lifting.  Do not exercise if it is very hot or  humid or if you are at a high altitude.  If you choose to, you may continue to have sex unless your health care provider tells you not to. Relieving pain and discomfort  Take frequent breaks and rest with your legs raised (elevated) if you have leg cramps or low back pain.  Take warm sitz baths to soothe any pain or discomfort caused by hemorrhoids. Use hemorrhoid cream if your health care provider approves.  Wear a supportive bra to prevent discomfort from breast tenderness.  If you develop varicose veins: ? Wear support hose as told by your health care provider. ? Elevate your feet for 15 minutes, 3-4 times a day. ? Limit salt in your diet. Safety  Talk to your health care provider before traveling far distances.  Do not use hot tubs, steam rooms, or saunas.  Wear your seat belt at all times when driving or riding in a car.  Talk with your health care provider if someone is verbally or physically abusive to you. Preparing for birth To prepare for the arrival of your baby:  Take prenatal classes to understand, practice, and ask questions about labor and delivery.  Visit the hospital and tour the maternity area.  Purchase a rear-facing car seat and make sure you know how to install it in your car.  Prepare the baby's room or sleeping area. Make sure to remove all pillows and stuffed animals from the baby's crib to prevent suffocation. General instructions  Avoid cat litter boxes and soil used by cats. These carry germs that can cause birth defects in the baby. If you have a cat, ask someone to clean the litter box for you.  Do not douche or use tampons. Do not use scented sanitary pads.  Do not use any products that contain nicotine or tobacco, such as cigarettes, e-cigarettes, and chewing tobacco. If you need help quitting, ask your health care provider.  Do not use any herbal remedies, illegal drugs, or medicines that were not prescribed to you. Chemicals in these products  can harm your baby.  Do not drink alcohol.  You will have more frequent prenatal exams during the third trimester. During a routine prenatal visit, your health care provider will do a physical exam, perform tests, and discuss your overall health. Keep all follow-up visits. This is important. Where to find more information  American Pregnancy Association: americanpregnancy.org  American College of Obstetricians and Gynecologists: acog.org/en/Womens%20Health/Pregnancy  Office on Women's Health: womenshealth.gov/pregnancy Contact a health care provider if you have:  A fever.  Mild pelvic cramps, pelvic pressure, or nagging pain in your abdominal area or lower back.  Vomiting or diarrhea.  Bad-smelling vaginal discharge or foul-smelling urine.  Pain when you urinate.  A headache that does not go away when you take medicine.  Visual changes or see spots in front of your eyes. Get help right away if:  Your water breaks.  You have regular contractions less than 5 minutes apart.  You have spotting or bleeding from your vagina.  You   have severe abdominal pain.  You have difficulty breathing.  You have chest pain.  You have fainting spells.  You have not felt your baby move for the time period told by your health care provider.  You have new or increased pain, swelling, or redness in an arm or leg. Summary  The third trimester of pregnancy is from week 28 through week 40 (months 7 through 9).  You may have more problems sleeping. This can be caused by the size of your abdomen, an increased need to urinate, and an increase in your body's metabolism.  You will have more frequent prenatal exams during the third trimester. Keep all follow-up visits. This is important. This information is not intended to replace advice given to you by your health care provider. Make sure you discuss any questions you have with your health care provider. Document Revised: 06/13/2019 Document  Reviewed: 04/19/2019 Elsevier Patient Education  2021 Elsevier Inc.   Fetal Movement Counts Patient Name: ________________________________________________ Patient Due Date: ____________________  What is a fetal movement count? A fetal movement count is the number of times that you feel your baby move during a certain amount of time. This may also be called a fetal kick count. A fetal movement count is recommended for every pregnant woman. You may be asked to start counting fetal movements as early as week 28 of your pregnancy. Pay attention to when your baby is most active. You may notice your baby's sleep and wake cycles. You may also notice things that make your baby move more. You should do a fetal movement count:  When your baby is normally most active.  At the same time each day. A good time to count movements is while you are resting, after having something to eat and drink. How do I count fetal movements? 1. Find a quiet, comfortable area. Sit, or lie down on your side. 2. Write down the date, the start time and stop time, and the number of movements that you felt between those two times. Take this information with you to your health care visits. 3. Write down your start time when you feel the first movement. 4. Count kicks, flutters, swishes, rolls, and jabs. You should feel at least 10 movements. 5. You may stop counting after you have felt 10 movements, or if you have been counting for 2 hours. Write down the stop time. 6. If you do not feel 10 movements in 2 hours, contact your health care provider for further instructions. Your health care provider may want to do additional tests to assess your baby's well-being. Contact a health care provider if:  You feel fewer than 10 movements in 2 hours.  Your baby is not moving like he or she usually does. Date: ____________ Start time: ____________ Stop time: ____________ Movements: ____________ Date: ____________ Start time:  ____________ Stop time: ____________ Movements: ____________ Date: ____________ Start time: ____________ Stop time: ____________ Movements: ____________ Date: ____________ Start time: ____________ Stop time: ____________ Movements: ____________ Date: ____________ Start time: ____________ Stop time: ____________ Movements: ____________ Date: ____________ Start time: ____________ Stop time: ____________ Movements: ____________ Date: ____________ Start time: ____________ Stop time: ____________ Movements: ____________ Date: ____________ Start time: ____________ Stop time: ____________ Movements: ____________ Date: ____________ Start time: ____________ Stop time: ____________ Movements: ____________ This information is not intended to replace advice given to you by your health care provider. Make sure you discuss any questions you have with your health care provider. Document Revised: 08/24/2018 Document Reviewed: 08/24/2018 Elsevier Patient  Education  2021 Elsevier Inc.  

## 2020-06-11 LAB — CULTURE, STREPTOCOCCUS GRP B W/SUSCEPT
MICRO NUMBER:: 11916395
SPECIMEN QUALITY:: ADEQUATE

## 2020-06-12 ENCOUNTER — Other Ambulatory Visit: Payer: Self-pay | Admitting: Advanced Practice Midwife

## 2020-06-12 ENCOUNTER — Other Ambulatory Visit: Payer: Self-pay

## 2020-06-12 ENCOUNTER — Inpatient Hospital Stay (HOSPITAL_COMMUNITY)
Admission: AD | Admit: 2020-06-12 | Discharge: 2020-06-14 | DRG: 807 | Disposition: A | Discharge status: Home / Self Care | Payer: Self-pay | Attending: Obstetrics & Gynecology | Admitting: Obstetrics & Gynecology

## 2020-06-12 ENCOUNTER — Inpatient Hospital Stay (HOSPITAL_COMMUNITY): Payer: Self-pay | Admitting: Anesthesiology

## 2020-06-12 ENCOUNTER — Inpatient Hospital Stay (HOSPITAL_COMMUNITY): Payer: Self-pay

## 2020-06-12 ENCOUNTER — Encounter (HOSPITAL_COMMUNITY): Payer: Self-pay | Admitting: Obstetrics & Gynecology

## 2020-06-12 DIAGNOSIS — Z3A37 37 weeks gestation of pregnancy: Secondary | ICD-10-CM

## 2020-06-12 DIAGNOSIS — O0990 Supervision of high risk pregnancy, unspecified, unspecified trimester: Secondary | ICD-10-CM

## 2020-06-12 DIAGNOSIS — O09529 Supervision of elderly multigravida, unspecified trimester: Secondary | ICD-10-CM

## 2020-06-12 DIAGNOSIS — O99820 Streptococcus B carrier state complicating pregnancy: Secondary | ICD-10-CM

## 2020-06-12 DIAGNOSIS — O99214 Obesity complicating childbirth: Secondary | ICD-10-CM | POA: Diagnosis present

## 2020-06-12 DIAGNOSIS — O134 Gestational [pregnancy-induced] hypertension without significant proteinuria, complicating childbirth: Secondary | ICD-10-CM

## 2020-06-12 DIAGNOSIS — O99824 Streptococcus B carrier state complicating childbirth: Secondary | ICD-10-CM | POA: Diagnosis present

## 2020-06-12 DIAGNOSIS — O133 Gestational [pregnancy-induced] hypertension without significant proteinuria, third trimester: Secondary | ICD-10-CM

## 2020-06-12 DIAGNOSIS — Z880 Allergy status to penicillin: Secondary | ICD-10-CM

## 2020-06-12 LAB — TYPE AND SCREEN
ABO/RH(D): O POS
Antibody Screen: NEGATIVE

## 2020-06-12 LAB — CBC
HCT: 32.8 % — ABNORMAL LOW (ref 36.0–46.0)
HCT: 33 % — ABNORMAL LOW (ref 36.0–46.0)
Hemoglobin: 10.9 g/dL — ABNORMAL LOW (ref 12.0–15.0)
Hemoglobin: 10.9 g/dL — ABNORMAL LOW (ref 12.0–15.0)
MCH: 27.8 pg (ref 26.0–34.0)
MCH: 27.9 pg (ref 26.0–34.0)
MCHC: 33.2 g/dL (ref 30.0–36.0)
MCHC: 33 g/dL (ref 30.0–36.0)
MCV: 83.7 fL (ref 80.0–100.0)
MCV: 84.6 fL (ref 80.0–100.0)
Platelets: 260 10*3/uL (ref 150–400)
Platelets: 238 10*3/uL (ref 150–400)
RBC: 3.92 MIL/uL (ref 3.87–5.11)
RBC: 3.9 MIL/uL (ref 3.87–5.11)
RDW: 13.7 % (ref 11.5–15.5)
RDW: 13.7 % (ref 11.5–15.5)
WBC: 11.3 10*3/uL — ABNORMAL HIGH (ref 4.0–10.5)
WBC: 10.5 10*3/uL (ref 4.0–10.5)
nRBC: 0 % (ref 0.0–0.2)
nRBC: 0 % (ref 0.0–0.2)

## 2020-06-12 LAB — COMPREHENSIVE METABOLIC PANEL
ALT: 15 U/L (ref 0–44)
AST: 15 U/L (ref 15–41)
Albumin: 2.6 g/dL — ABNORMAL LOW (ref 3.5–5.0)
Alkaline Phosphatase: 112 U/L (ref 38–126)
Anion gap: 9 (ref 5–15)
BUN: 10 mg/dL (ref 6–20)
CO2: 19 mmol/L — ABNORMAL LOW (ref 22–32)
Calcium: 8.7 mg/dL — ABNORMAL LOW (ref 8.9–10.3)
Chloride: 105 mmol/L (ref 98–111)
Creatinine, Ser: 0.51 mg/dL (ref 0.44–1.00)
GFR, Estimated: >60 mL/min (ref >60)
Glucose, Bld: 91 mg/dL (ref 70–99)
Potassium: 3.8 mmol/L (ref 3.5–5.1)
Sodium: 133 mmol/L — ABNORMAL LOW (ref 135–145)
Total Bilirubin: 0.4 mg/dL (ref 0.3–1.2)
Total Protein: 6.1 g/dL — ABNORMAL LOW (ref 6.5–8.1)

## 2020-06-12 LAB — RPR: RPR Ser Ql: NONREACTIVE

## 2020-06-12 LAB — PROTEIN / CREATININE RATIO, URINE
Creatinine, Urine: 223.82 mg/dL
Protein Creatinine Ratio: 0.08 mg/mg{Cre} (ref 0.00–0.15)
Total Protein, Urine: 19 mg/dL

## 2020-06-12 MED ORDER — LACTATED RINGERS IV SOLN: 500.0000 mL | Freq: Once | INTRAVENOUS | Status: DC

## 2020-06-12 MED ORDER — PHENYLEPHRINE 40 MCG/ML (10ML) SYRINGE FOR IV PUSH (FOR BLOOD PRESSURE SUPPORT): 80.0000 ug | PREFILLED_SYRINGE | INTRAVENOUS | Status: DC | PRN

## 2020-06-12 MED ORDER — OXYCODONE-ACETAMINOPHEN 5-325 MG PO TABS: 1.0000 | ORAL_TABLET | ORAL | Status: DC | PRN

## 2020-06-12 MED ORDER — MISOPROSTOL 50MCG HALF TABLET
50.0000 ug | ORAL_TABLET | Freq: Once | ORAL | Status: AC
  Administered 2020-06-12: 50 ug via BUCCAL

## 2020-06-12 MED ORDER — TERBUTALINE SULFATE 1 MG/ML IJ SOLN: 0.2500 mg | Freq: Once | INTRAMUSCULAR | Status: DC | PRN

## 2020-06-12 MED ORDER — LACTATED RINGERS IV SOLN
500.0000 mL | INTRAVENOUS | Status: DC | PRN
  Administered 2020-06-12: 500 mL via INTRAVENOUS

## 2020-06-12 MED ORDER — MISOPROSTOL 50MCG HALF TABLET
ORAL_TABLET | ORAL | Status: AC
  Filled 2020-06-12: qty 1

## 2020-06-12 MED ORDER — ONDANSETRON HCL 4 MG/2ML IJ SOLN: 4.0000 mg | Freq: Four times a day (QID) | INTRAMUSCULAR | Status: DC | PRN

## 2020-06-12 MED ORDER — LIDOCAINE HCL (PF) 1 % IJ SOLN
INTRAMUSCULAR | Status: DC | PRN
  Administered 2020-06-12: 3 mL via EPIDURAL
  Administered 2020-06-12: 2 mL via EPIDURAL
  Administered 2020-06-12: 5 mL via EPIDURAL

## 2020-06-12 MED ORDER — OXYTOCIN-SODIUM CHLORIDE 30-0.9 UT/500ML-% IV SOLN
1.0000 m[IU]/min | INTRAVENOUS | Status: DC
  Administered 2020-06-12: 2 m[IU]/min via INTRAVENOUS

## 2020-06-12 MED ORDER — SOD CITRATE-CITRIC ACID 500-334 MG/5ML PO SOLN
30.0000 mL | ORAL | Status: DC | PRN
  Administered 2020-06-12 (×2): 30 mL via ORAL
  Filled 2020-06-12 (×2): qty 15

## 2020-06-12 MED ORDER — OXYCODONE-ACETAMINOPHEN 5-325 MG PO TABS: 2.0000 | ORAL_TABLET | ORAL | Status: DC | PRN

## 2020-06-12 MED ORDER — EPHEDRINE 5 MG/ML INJ: 10.0000 mg | INTRAVENOUS | Status: DC | PRN

## 2020-06-12 MED ORDER — LACTATED RINGERS IV SOLN: INTRAVENOUS | Status: DC

## 2020-06-12 MED ORDER — LIDOCAINE HCL (PF) 1 % IJ SOLN: 30.0000 mL | INTRAMUSCULAR | Status: DC | PRN

## 2020-06-12 MED ORDER — FENTANYL-BUPIVACAINE-NACL 0.5-0.125-0.9 MG/250ML-% EP SOLN
12.0000 mL/h | EPIDURAL | Status: DC | PRN
  Administered 2020-06-12: 12 mL/h via EPIDURAL
  Filled 2020-06-12: qty 250

## 2020-06-12 MED ORDER — OXYTOCIN-SODIUM CHLORIDE 30-0.9 UT/500ML-% IV SOLN
2.5000 [IU]/h | INTRAVENOUS | Status: DC
  Administered 2020-06-12: 2.5 [IU]/h via INTRAVENOUS
  Filled 2020-06-12: qty 500

## 2020-06-12 MED ORDER — CLINDAMYCIN PHOSPHATE 900 MG/50ML IV SOLN
900.0000 mg | Freq: Three times a day (TID) | INTRAVENOUS | Status: DC
  Administered 2020-06-12 (×2): 900 mg via INTRAVENOUS
  Filled 2020-06-12 (×2): qty 50

## 2020-06-12 MED ORDER — DIPHENHYDRAMINE HCL 50 MG/ML IJ SOLN: 12.5000 mg | INTRAMUSCULAR | Status: DC | PRN

## 2020-06-12 MED ORDER — LACTATED RINGERS IV SOLN
500.0000 mL | Freq: Once | INTRAVENOUS | Status: AC
  Administered 2020-06-12: 500 mL via INTRAVENOUS

## 2020-06-12 MED ORDER — ACETAMINOPHEN 325 MG PO TABS
650.0000 mg | ORAL_TABLET | ORAL | Status: DC | PRN
  Administered 2020-06-12: 650 mg via ORAL
  Filled 2020-06-12 (×2): qty 2

## 2020-06-12 MED ORDER — OXYTOCIN BOLUS FROM INFUSION
333.0000 mL | Freq: Once | INTRAVENOUS | Status: AC
  Administered 2020-06-12: 333 mL via INTRAVENOUS

## 2020-06-12 NOTE — Progress Notes (Signed)
Labor Progress Note Alice Waters is a 35 y.o. [phone] at [redacted]w[redacted]d presented for induction of labor for gHTN S: Feeling some crampiness  O:  BP 139/87   Pulse 91   Temp 98.5 F (36.9 C) (Axillary)   Resp 16   LMP 09/19/2019  EFM: 135/mod variability/pos accels/neg decels  CVE: Dilation: 3 Effacement (%): 50 Cervical Position: Posterior Station: -3 Presentation: Vertex Exam by:: Valarie Merino RN   A&P: 35 y.o. [id number] [redacted]w[redacted]d here for gHTN   #Labor: Progressing well. S/p cytotec x1, started on pitocin around 1400.  #GHTN: mild range pressures since admission. PEC labs normal.   #Pain: per patient request #FWB: cat I  #GBS positive, PCN allergy, on clindamycin    [name], MD 3:05 PM

## 2020-06-12 NOTE — Progress Notes (Addendum)
Labor Progress Note Alice Waters is a 35 y.o. [phone] at [redacted]w[redacted]d presented for IOL for gHTN S: feeling more pressure with contractions, comfortable with epidural  O:  BP 127/74   Pulse 99   Temp 98.9 F (37.2 C) (Axillary)   Resp 16   LMP 09/19/2019   SpO2 98%  EFM: 150/moderate variability/+accels/early decels Toco: contractions every 2-4 min  CVE: Dilation: 10 Dilation Complete Date: 06/12/20 Dilation Complete Time: 2145 Effacement (%): 50 Cervical Position: Posterior Station: 0 Presentation: Vertex Exam by:: MScott, RN and Dr. Shari Heritage   A&P: 35 y.o. [id number] [redacted]w[redacted]d for IOL for gHTN #Labor: Progressing well. Active, now fully dilated. S/p cytotec x1, pitocin started at 1400 currently at 12 mu/min. S/p AROM at 1758. #Pain: epidural #FWB: cat I #GBS positive, on clindamycin (PCN allergy) #gHTN: mild range pressures since admission, normotensive currently. PEC labs unremarkable.  [name], MD 9:51 PM

## 2020-06-12 NOTE — Anesthesia Procedure Notes (Signed)
Epidural Patient location during procedure: OB Start time: 06/12/2020 6:58 PM End time: 06/12/2020 7:04 PM  Staffing Anesthesiologist: Cecile Hearing, MD Performed: anesthesiologist   Preanesthetic Checklist Completed: patient identified, IV checked, risks and benefits discussed, monitors and equipment checked, pre-op evaluation and timeout performed  Epidural Patient position: sitting Prep: DuraPrep Patient monitoring: blood pressure and continuous pulse ox Approach: midline Location: L3-L4 Injection technique: LOR air  Needle:  Needle type: Tuohy  Needle gauge: 17 G Needle length: 9 cm Needle insertion depth: 6 cm Catheter size: 19 Gauge Catheter at skin depth: 11 cm Test dose: negative and Other (1% Lidocaine)  Additional Notes Patient identified.  Risk benefits discussed including failed block, incomplete pain control, headache, nerve damage, paralysis, blood pressure changes, nausea, vomiting, reactions to medication both toxic or allergic, and postpartum back pain.  Patient expressed understanding and wished to proceed.  All questions were answered.  Sterile technique used throughout procedure and epidural site dressed with sterile barrier dressing. No paresthesia or other complications noted. The patient did not experience any signs of intravascular injection such as tinnitus or metallic taste in mouth nor signs of intrathecal spread such as rapid motor block. Please see nursing notes for vital signs. Reason for block:procedure for pain

## 2020-06-12 NOTE — H&P (Signed)
Obstetric History and Physical  Alice Waters is a 35 y.o. [phone] with IUP at [redacted]w[redacted]d presenting for induction of labor for [facility]. Patient states she has been having rare contractions, no vaginal bleeding, intact membranes, with active fetal movement.  Patient denies any headaches, visual symptoms, RUQ/epigastric pain or other concerning symptoms.  Prenatal Course Source of Care: [facility] Pregnancy complications or risks: Patient Active Problem List   Diagnosis Date Noted  . Gestational hypertension, third trimester 04/02/2020  . Supervision of high-risk pregnancy 11/30/2019  . Advanced maternal age in multigravida 11/30/2019  . History of delivery of macrosomal infant 11/30/2019  . Maternal morbid obesity, antepartum (HCC) 11/30/2019  . PCOS (polycystic ovarian syndrome) 08/16/2018   Nursing Staff Provider  Office Location  [facility] Dating  Bedside U/S  Language  English Anatomy US  normal female but incomplete views of spine> normal  Flu Vaccine   declined Genetic Screen  declined    TDaP Vaccine   04/11/20 Hgb A1C or  GTT Early:  normal Third trimester: normal  COVID Vaccine  declined   LAB RESULTS   Rhogam  NA Blood Type O/RH(D) POSITIVE/-- (11/12 1044)   Feeding Plan breast Antibody NO ANTIBODIES DETECTED (11/12 1044)  Contraception NFP/Vasectomy Rubella 1.68 (11/12 1044)  Circumcision n/a RPR NON-REACTIVE (11/12 1044)   Pediatrician  [facility] HBsAg NON-REACTIVE (11/12 1044)   Support Person Jimmy HCVAb negative  Prenatal Classes declines HIV NON-REACTIVE (11/12 1044)     BTL Consent declines GBS   (For PCN allergy, check sensitivities)   VBAC Consent declines Pap  normal 11/30/19    Hgb Electro  declined  BP Cuff Pt has cuff CF declined    SMA declined    Waterbirth  n/a    Induction  [ ]  Orders Entered [ ] Foley Y/N   05/26/2020 [redacted]w[redacted]d   EFW 2879 gm/6 lb 6 oz/ 95  %  Past Medical History:  Diagnosis Date  . Gestational hypertension   . History of PCOS      Past Surgical History:  Procedure Laterality Date  . WISDOM TOOTH EXTRACTION      OB History  Gravida Para Term Preterm AB Living  4 3 3     3   SAB IAB Ectopic Multiple Live Births          3    # Outcome Date GA Lbr Len/2nd Weight Sex Delivery Anes PTL Lv  4 Current           3 Term 05/23/13 [redacted]w[redacted]d  4600 g M Vag-Spont   LIV     Birth Comments: IOL, no SD  2 Term 08/26/10 [redacted]w[redacted]d  4423 g F Vag-Spont   LIV     Birth Comments: poly, IOL  1 Term 09/24/07 [redacted]w[redacted]d  3941 g F Vag-Spont   LIV     Birth Comments: poly, IOL, chorio    Social History   Socioeconomic History  . Marital status: Married    Spouse name: [redacted]w[redacted]d  . Number of children: 3  . Years of education: Not on file  . Highest education level: Not on file  Occupational History  . Not on file  Tobacco Use  . Smoking status: Never Smoker  . Smokeless tobacco: Never Used  Vaping Use  . Vaping Use: Never used  Substance and Sexual Activity  . Alcohol use: Never  . Drug use: Never  . Sexual activity: Yes    Birth control/protection: None  Other Topics Concern  . Not on file  Social History Narrative  . Not on file   Social Determinants of Health   Financial Resource Strain: Not on file  Food Insecurity: Not on file  Transportation Needs: Not on file  Physical Activity: Not on file  Stress: Not on file  Social Connections: Not on file    Family History  Problem Relation Age of Onset  . Thyroid disease Mother   . Hypertension Father   . COPD Father   . Congestive Heart Failure Father   . Breast cancer Paternal Grandmother     Medications Prior to Admission  Medication Sig Dispense Refill Last Dose  . aspirin EC 81 MG tablet Take 1 tablet (81 mg total) by mouth daily. Swallow whole. 60 tablet 3 06/11/2020 at Unknown time  . Prenatal Vit-Fe Fumarate-FA (PRENATAL VITAMIN PO) Take by mouth.   06/11/2020 at Unknown time    Allergies  Allergen Reactions  . Penicillins Anaphylaxis, Hives and Rash     Review of Systems: Negative except for what is mentioned in HPI.  Physical Exam: BP (!) 142/80   Pulse 86   Temp 98.7 F (37.1 C) (Axillary)   LMP 09/19/2019  CONSTITUTIONAL: Well-developed, well-nourished female in no acute distress.  HENT:  Normocephalic, atraumatic, External right and left ear normal.  EYES: Conjunctivae and EOM are normal. Pupils are equal, round, and reactive to light. No scleral icterus.  NECK: Normal range of motion, supple, no masses SKIN: Skin is warm and dry. No rash noted. Not diaphoretic. No erythema. No pallor. NEUROLOGIC: Alert and oriented to person, place, and time. Normal reflexes, muscle tone coordination. No cranial nerve deficit noted. PSYCHIATRIC: Normal mood and affect. Normal behavior. Normal judgment and thought content. CARDIOVASCULAR: Normal heart rate noted, regular rhythm RESPIRATORY: Effort and breath sounds normal, no problems with respiration noted ABDOMEN: Soft, nontender, nondistended, gravid. MUSCULOSKELETAL: Normal range of motion. No edema and no tenderness. 2+ distal pulses.  Cervical Exam: Dilation: 2 Effacement (%): Thick Cervical Position: Posterior Station: -3 Presentation: Vertex Exam by:: Valarie Merino RN Presentation: cephalic FHT:  Baseline rate 130 bpm   Variability moderate  Accelerations present   Decelerations none Contractions: Irregular   Pertinent Labs/Studies:   Results for orders placed or performed during the hospital encounter of 06/12/20 (from the past 24 hour(s))  CBC     Status: Abnormal   Collection Time: 06/12/20  8:50 AM  Result Value Ref Range   WBC 11.3 (H) 4.0 - 10.5 K/uL   RBC 3.92 3.87 - 5.11 MIL/uL   Hemoglobin 10.9 (L) 12.0 - 15.0 g/dL   HCT 12.7 (L) 51.7 - 00.1 %   MCV 83.7 80.0 - 100.0 fL   MCH 27.8 26.0 - 34.0 pg   MCHC 33.2 30.0 - 36.0 g/dL   RDW 74.9 44.9 - 67.5 %   Platelets 260 150 - 400 K/uL   nRBC 0.0 0.0 - 0.2 %  Type and screen     Status: None   Collection Time:  06/12/20  8:50 AM  Result Value Ref Range   ABO/RH(D) O POS    Antibody Screen NEG    Sample Expiration      06/15/2020,2359 Performed at [facility], [address], [place], [place] [address]   RPR     Status: None   Collection Time: 06/12/20  8:50 AM  Result Value Ref Range   RPR Ser Ql NON REACTIVE NON REACTIVE  Comprehensive metabolic panel     Status: Abnormal   Collection Time:  06/12/20  8:50 AM  Result Value Ref Range   Sodium 133 (L) 135 - 145 mmol/L   Potassium 3.8 3.5 - 5.1 mmol/L   Chloride 105 98 - 111 mmol/L   CO2 19 (L) 22 - 32 mmol/L   Glucose, Bld 91 70 - 99 mg/dL   BUN 10 6 - 20 mg/dL   Creatinine, Ser 6.29 0.44 - 1.00 mg/dL   Calcium 8.7 (L) 8.9 - 10.3 mg/dL   Total Protein 6.1 (L) 6.5 - 8.1 g/dL   Albumin 2.6 (L) 3.5 - 5.0 g/dL   AST 15 15 - 41 U/L   ALT 15 0 - 44 U/L   Alkaline Phosphatase 112 38 - 126 U/L   Total Bilirubin 0.4 0.3 - 1.2 mg/dL   GFR, Estimated >47 >65 mL/min   Anion gap 9 5 - 15    Assessment : Sherly Brodbeck is a 35 y.o. G4P3003 at [redacted]w[redacted]d being admitted for induction of labor due to [facility].  Plan: Labor: She received buccal Misoprostol 50 mcg around 1030. Will recheck around 1430, determine need for further cervical ripening vs pitocin. Analgesia as needed. FWB: Reassuring fetal heart tracing.  GBS positive, Clindamycin ordered based on sensitivities (patient has anaphylaxis reaction to PCN) Delivery plan: Hopeful for vaginal delivery   Jaynie Collins, MD, FACOG Obstetrician & Gynecologist, [facility] for [employer], [facility]

## 2020-06-12 NOTE — Anesthesia Preprocedure Evaluation (Signed)
Anesthesia Evaluation  Patient identified by MRN, date of birth, ID band Patient awake    Reviewed: Allergy & Precautions, NPO status , Patient's Chart, lab work & pertinent test results  Airway Mallampati: II  TM Distance: >3 FB Neck ROM: Full    Dental  (+) Teeth Intact, Dental Advisory Given   Pulmonary neg pulmonary ROS,    Pulmonary exam normal breath sounds clear to auscultation       Cardiovascular hypertension (GHTN), Normal cardiovascular exam Rhythm:Regular Rate:Normal     Neuro/Psych negative neurological ROS     GI/Hepatic negative GI ROS, Neg liver ROS,   Endo/Other  Morbid obesity  Renal/GU negative Renal ROS     Musculoskeletal negative musculoskeletal ROS (+)   Abdominal   Peds  Hematology  (+) Blood dyscrasia, anemia , Plt 238k    Anesthesia Other Findings Day of surgery medications reviewed with the patient.  Reproductive/Obstetrics (+) Pregnancy                             Anesthesia Physical Anesthesia Plan  ASA: III  Anesthesia Plan: Epidural   Post-op Pain Management:    Induction:   PONV Risk Score and Plan: 2 and Treatment may vary due to age or medical condition  Airway Management Planned: Natural Airway  Additional Equipment:   Intra-op Plan:   Post-operative Plan:   Informed Consent: I have reviewed the patients History and Physical, chart, labs and discussed the procedure including the risks, benefits and alternatives for the proposed anesthesia with the patient or authorized representative who has indicated his/her understanding and acceptance.     Dental advisory given  Plan Discussed with:   Anesthesia Plan Comments: (Patient identified. Risks/Benefits/Options discussed with patient including but not limited to bleeding, infection, nerve damage, paralysis, failed block, incomplete pain control, headache, blood pressure changes, nausea,  vomiting, reactions to medication both or allergic, itching and postpartum back pain. Confirmed with bedside nurse the patient's most recent platelet count. Confirmed with patient that they are not currently taking any anticoagulation, have any bleeding history or any family history of bleeding disorders. Patient expressed understanding and wished to proceed. All questions were answered. )        Anesthesia Quick Evaluation

## 2020-06-12 NOTE — Discharge Summary (Signed)
Postpartum Discharge Summary    Patient Name: Alice Waters DOB: 1985-07-31 MRN: [id number]  Date of admission: 06/12/2020 Delivery date:06/12/2020  Delivering provider: Christin Fudge  Date of discharge: 06/14/2020  Admitting diagnosis: Gestational hypertension [O13.9] Intrauterine pregnancy: [redacted]w[redacted]d    Secondary diagnosis:  Principal Problem:   Gestational hypertension, third trimester Active Problems:   Supervision of high-risk pregnancy   Advanced maternal age in multigravida  Additional problems: none    Discharge diagnosis: Term Pregnancy Delivered                                              Post partum procedures:none Augmentation: AROM, Pitocin and Cytotec Complications: None  Hospital course: Induction of Labor With Vaginal Delivery   35y.o. yo [id number] admitted to the hospital 06/12/2020 for induction of labor.  Indication for induction: Gestational hypertension.  Patient had an uncomplicated labor course as follows: Membrane Rupture Time/Date: 5:58 PM ,06/12/2020   Delivery Method:Vaginal, Spontaneous  Episiotomy: None  Lacerations:  2nd degree;Vaginal  Details of delivery can be found in separate delivery note.  Patient had a routine postpartum course. Patient is discharged home 06/14/20. Given persistent mild range blood pressures s/p delivery, pt was discharged on procardia 3068maily with plan for 1 week blood pressure check in clinic. Counseled on preeclampsia return precautions prior to discharge.  Newborn Data: Birth date:06/12/2020  Birth time:10:47 PM  Gender:Female  Living status:Living  Apgars:9 ,9  Weight:3660 g   Magnesium Sulfate received: No BMZ received: No Rhophylac:N/A MMR:N/A T-DaP:Given prenatally Flu: No Transfusion:No  Physical exam  Vitals:   06/13/20 1347 06/13/20 1430 06/13/20 2149 06/14/20 0537  BP: 128/80 125/78 129/84 129/76  Pulse: 88 78 95 77  Resp: 19 18 16 19   Temp: 98.2 F (36.8 C) 97.8 F (36.6  C) 98 F (36.7 C) 98 F (36.7 C)  TempSrc: Oral Oral Oral Oral  SpO2:  97% 99% 98%   General: alert, cooperative and no distress Lochia: appropriate Uterine Fundus: firm Incision: N/A DVT Evaluation: No evidence of DVT seen on physical exam. No cords or calf tenderness. No significant calf/ankle edema. Labs: Lab Results  Component Value Date   WBC 12.2 (H) 06/13/2020   HGB 10.1 (L) 06/13/2020   HCT 30.8 (L) 06/13/2020   MCV 84.4 06/13/2020   PLT 232 06/13/2020   CMP Latest Ref Rng & Units 06/12/2020  Glucose 70 - 99 mg/dL 91  BUN 6 - 20 mg/dL 10  Creatinine 0.44 - 1.00 mg/dL 0.51  Sodium 135 - 145 mmol/L 133(L)  Potassium 3.5 - 5.1 mmol/L 3.8  Chloride 98 - 111 mmol/L 105  CO2 22 - 32 mmol/L 19(L)  Calcium 8.9 - 10.3 mg/dL 8.7(L)  Total Protein 6.5 - 8.1 g/dL 6.1(L)  Total Bilirubin 0.3 - 1.2 mg/dL 0.4  Alkaline Phos 38 - 126 U/L 112  AST 15 - 41 U/L 15  ALT 0 - 44 U/L 15   Edinburgh Score: Edinburgh Postnatal Depression Scale Screening Tool 06/13/2020  I have been able to laugh and see the funny side of things. 0  I have looked forward with enjoyment to things. 0  I have blamed myself unnecessarily when things went wrong. 2  I have been anxious or worried for no good reason. 0  I have felt scared or panicky for no good reason. 0  Things have been getting on top of me. 1  I have been so unhappy that I have had difficulty sleeping. 0  I have felt sad or miserable. 1  I have been so unhappy that I have been crying. 1  The thought of harming myself has occurred to me. 0  Edinburgh Postnatal Depression Scale Total 5     After visit meds:  Allergies as of 06/14/2020      Reactions   Penicillins Anaphylaxis, Hives, Rash      Medication List    STOP taking these medications   aspirin EC 81 MG tablet     TAKE these medications   ibuprofen 600 MG tablet Commonly known as: ADVIL Take 1 tablet (600 mg total) by mouth every 6 (six) hours.   NIFEdipine 30 MG 24  hr tablet Commonly known as: ADALAT CC Take 1 tablet (30 mg total) by mouth daily.   PRENATAL VITAMIN PO Take by mouth.      Discharge home in stable condition Infant Feeding: Breast Infant Disposition:home with mother Discharge instruction: per After Visit Summary and Postpartum booklet. Activity: Advance as tolerated. Pelvic rest for 6 weeks.  Diet: routine diet Future Appointments: Future Appointments  Date Time Provider [place]  07/11/2020  8:50 AM Julianne Handler, CNM [facility]   Follow up Visit: Please schedule this patient for a Virtual postpartum visit in 4 weeks with the following provider: Any provider. Additional Postpartum F/U:BP check 1 week  Low risk pregnancy complicated by: HTN Delivery mode:  Vaginal, Spontaneous  Anticipated Birth Control:  NFP/vasectomy  Abagale Boulos, Gildardo Cranker, MD OB Fellow, [facility] 06/14/2020 7:13 AM

## 2020-06-13 ENCOUNTER — Telehealth: Payer: Self-pay | Admitting: *Deleted

## 2020-06-13 ENCOUNTER — Other Ambulatory Visit (HOSPITAL_COMMUNITY): Payer: Self-pay

## 2020-06-13 LAB — CBC
HCT: 30.8 % — ABNORMAL LOW (ref 36.0–46.0)
Hemoglobin: 10.1 g/dL — ABNORMAL LOW (ref 12.0–15.0)
MCH: 27.7 pg (ref 26.0–34.0)
MCHC: 32.8 g/dL (ref 30.0–36.0)
MCV: 84.4 fL (ref 80.0–100.0)
Platelets: 232 10*3/uL (ref 150–400)
RBC: 3.65 MIL/uL — ABNORMAL LOW (ref 3.87–5.11)
RDW: 13.7 % (ref 11.5–15.5)
WBC: 12.2 10*3/uL — ABNORMAL HIGH (ref 4.0–10.5)
nRBC: 0 % (ref 0.0–0.2)

## 2020-06-13 MED ORDER — BENZOCAINE-MENTHOL 20-0.5 % EX AERO: 1.0000 "application " | INHALATION_SPRAY | CUTANEOUS | Status: DC | PRN

## 2020-06-13 MED ORDER — WITCH HAZEL-GLYCERIN EX PADS: 1.0000 "application " | MEDICATED_PAD | CUTANEOUS | Status: DC | PRN

## 2020-06-13 MED ORDER — DIPHENHYDRAMINE HCL 25 MG PO CAPS: 25.0000 mg | ORAL_CAPSULE | Freq: Four times a day (QID) | ORAL | Status: DC | PRN

## 2020-06-13 MED ORDER — SENNOSIDES-DOCUSATE SODIUM 8.6-50 MG PO TABS
2.0000 | ORAL_TABLET | Freq: Every day | ORAL | Status: DC
  Administered 2020-06-13 – 2020-06-14 (×2): 2 via ORAL
  Filled 2020-06-13 (×2): qty 2

## 2020-06-13 MED ORDER — NIFEDIPINE ER 30 MG PO TB24
30.0000 mg | ORAL_TABLET | Freq: Every day | ORAL | 3 refills | Status: AC
  Filled 2020-06-13: qty 30, 30d supply, fill #0

## 2020-06-13 MED ORDER — ONDANSETRON HCL 4 MG PO TABS: 4.0000 mg | ORAL_TABLET | ORAL | Status: DC | PRN

## 2020-06-13 MED ORDER — COCONUT OIL OIL: 1.0000 "application " | TOPICAL_OIL | Status: DC | PRN

## 2020-06-13 MED ORDER — SIMETHICONE 80 MG PO CHEW: 80.0000 mg | CHEWABLE_TABLET | ORAL | Status: DC | PRN

## 2020-06-13 MED ORDER — OXYCODONE HCL 5 MG PO TABS: 5.0000 mg | ORAL_TABLET | ORAL | Status: DC | PRN

## 2020-06-13 MED ORDER — TETANUS-DIPHTH-ACELL PERTUSSIS 5-2.5-18.5 LF-MCG/0.5 IM SUSY: 0.5000 mL | PREFILLED_SYRINGE | Freq: Once | INTRAMUSCULAR | Status: DC

## 2020-06-13 MED ORDER — ONDANSETRON HCL 4 MG/2ML IJ SOLN: 4.0000 mg | INTRAMUSCULAR | Status: DC | PRN

## 2020-06-13 MED ORDER — IBUPROFEN 600 MG PO TABS
600.0000 mg | ORAL_TABLET | Freq: Four times a day (QID) | ORAL | Status: DC
  Administered 2020-06-13 – 2020-06-14 (×6): 600 mg via ORAL
  Filled 2020-06-13 (×6): qty 1

## 2020-06-13 MED ORDER — NIFEDIPINE ER OSMOTIC RELEASE 30 MG PO TB24
30.0000 mg | ORAL_TABLET | Freq: Every day | ORAL | Status: DC
  Administered 2020-06-13 – 2020-06-14 (×2): 30 mg via ORAL
  Filled 2020-06-13 (×2): qty 1

## 2020-06-13 MED ORDER — IBUPROFEN 600 MG PO TABS
600.0000 mg | ORAL_TABLET | Freq: Four times a day (QID) | ORAL | 0 refills | Status: AC
  Filled 2020-06-13: qty 30, 8d supply, fill #0

## 2020-06-13 MED ORDER — DIBUCAINE (PERIANAL) 1 % EX OINT: 1.0000 "application " | TOPICAL_OINTMENT | CUTANEOUS | Status: DC | PRN

## 2020-06-13 MED ORDER — PRENATAL MULTIVITAMIN CH
1.0000 | ORAL_TABLET | Freq: Every day | ORAL | Status: DC
  Administered 2020-06-13 – 2020-06-14 (×2): 1 via ORAL
  Filled 2020-06-13 (×2): qty 1

## 2020-06-13 MED ORDER — ACETAMINOPHEN 325 MG PO TABS: 650.0000 mg | ORAL_TABLET | ORAL | Status: DC | PRN

## 2020-06-13 MED ORDER — OXYCODONE HCL 5 MG PO TABS: 10.0000 mg | ORAL_TABLET | ORAL | Status: DC | PRN

## 2020-06-13 NOTE — Lactation Note (Signed)
This note was copied from a baby's chart. Lactation Consultation Note  Patient Name: Alice Waters [id number] Date: 06/13/2020 Reason for consult: Mother's request;Early term 37-38.6wks;Maternal endocrine disorder Age:36 hours P3, ETI female infant. LC entered the room, mom doing STS and infant cuing to breastfeed. Mom latched infant on mom's  left breast using the football hold position, infant was off and on the breast at first , infant started sustaining latch and was  still breastfeeding after 15 minutes when LC left the room. Mom knows to ask RN or LC for latch assistance if needed on MBU. Mom knows to breastfeed infant according to primal cues: licking, tasting, smacking, rooting, hands and fist in mouth, breastfeeding STS. LC discussed infant's input and output with parents.  Maternal Data Has patient been taught Hand Expression?: Yes Does the patient have breastfeeding experience prior to this delivery?: Yes How long did the patient breastfeed?: Per mom, she BF other 3 children for 8 weeks each, return to work , but plans stay home with 4 th child.  Feeding Mother's Current Feeding Choice: Breast Milk  LATCH Score Latch: Repeated attempts needed to sustain latch, nipple held in mouth throughout feeding, stimulation needed to elicit sucking reflex.  Audible Swallowing: A few with stimulation  Type of Nipple: Everted at rest and after stimulation  Comfort (Breast/Nipple): Soft / non-tender  Hold (Positioning): Assistance needed to correctly position infant at breast and maintain latch.  LATCH Score: 7   Lactation Tools Discussed/Used    Interventions Interventions: Breast feeding basics reviewed;Assisted with latch;Skin to skin;Breast compression;Adjust position;Support pillows;Position options;Education  Discharge Pump: Personal WIC Program: No  Consult Status Consult Status: Follow-up Date: 06/13/20 Follow-up type: In-patient    [name] 06/13/2020,  12:09 AM

## 2020-06-13 NOTE — Anesthesia Postprocedure Evaluation (Signed)
Anesthesia Post Note  Patient: Najat Olazabal  Procedure(s) Performed: AN AD HOC LABOR EPIDURAL     Patient location during evaluation: Mother Baby Anesthesia Type: Epidural Level of consciousness: awake and alert and oriented Pain management: satisfactory to patient Vital Signs Assessment: post-procedure vital signs reviewed and stable Respiratory status: respiratory function stable Cardiovascular status: stable Postop Assessment: no headache, no backache, epidural receding, patient able to bend at knees, no signs of nausea or vomiting, adequate PO intake and able to ambulate Anesthetic complications: no   No complications documented.  Last Vitals:  Vitals:   06/13/20 0227 06/13/20 0611  BP: 134/87 (!) 147/92  Pulse: 100 100  Resp: 19 18  Temp: 36.8 C 36.7 C  SpO2: 96% 97%    Last Pain:  Vitals:   06/13/20 0611  TempSrc: Oral  PainSc:    Pain Goal: Patients Stated Pain Goal: 0 (06/12/20 1838)                 Karleen Dolphin

## 2020-06-13 NOTE — Lactation Note (Signed)
This note was copied from a baby's chart. Lactation Consultation Note  Patient Name: Girl Taja Pentland [id number] Date: 06/13/2020 Reason for consult: Follow-up assessment Age:36 hours  Maternal Data:   Mom is an experienced breastfeeding mom. Trying to breast feed on arrival. Minimal assist.  Urged to call lactation as needed.  Feeding Mother's Current Feeding Choice: Breast Milk  LATCH Score Latch: Grasps breast easily, tongue down, lips flanged, rhythmical sucking.  Audible Swallowing: A few with stimulation  Type of Nipple: Everted at rest and after stimulation  Comfort (Breast/Nipple): Soft / non-tender  Hold (Positioning): No assistance needed to correctly position infant at breast.  LATCH Score: 9   Lactation Tools Discussed/Used Tools: Other (comment) (spoon)  Interventions Interventions: Assisted with latch;Hand express;Expressed milk  Discharge WIC Program: No  Consult Status Consult Status: Follow-up Date: 06/14/20 Follow-up type: In-patient    [facility] Michaelle Copas 06/13/2020, 12:37 PM

## 2020-06-13 NOTE — Discharge Instructions (Signed)

## 2020-06-13 NOTE — Telephone Encounter (Signed)
-----   Message from Jacklyn Shell, CNM sent at 06/12/2020 11:40 PM EDT ----- Regarding: pp message kv  Please schedule this patient for a Virtual postpartum visit in 4 weeks with the following provider: Any provider. Additional Postpartum F/U:BP check 1 week  Low risk pregnancy complicated by: HTN Delivery mode:  Vaginal, Spontaneous  Anticipated Birth Control:  NFP/vasectomy

## 2020-06-13 NOTE — Telephone Encounter (Signed)
Left patient an urgent message to call and schedule 1 week BP check and she can change in office postpartum to a virtual.

## 2020-06-14 NOTE — Lactation Note (Signed)
This note was copied from a baby's chart. Lactation Consultation Note  Patient Name: Alice Waters [id number] Date: 06/14/2020 Age:36 hours  Mom called for me to return to room as [name] had begun cueing. Infant latched with ease, but only suckled a couple of times before falling asleep at the breast. From that point forward, only non-nutritive sucking was noted, even with breast compressions. Mom concurred that she had been seeing the same behavior at the breast since birth.   B/c of infant's early-term status, I suggested that Mom bottle feed at every feeding (unless infant clearly does well at the breast) with EBM/formula, feeding infant until she is content.   I provided Mom with a hand pump & she is going to buy a DEBP. Size 24 flanges appear to be the appropriate size for her at this time. Her R breast might benefit from a slightly smaller flange, but that is dependent upon the brand she ends up purchasing. Mom encouraged to pump whenever infant would be due to feed.  Mom was appreciative of the consult & [name] was updated.  [name] [facility] 06/14/2020, 11:27 AM

## 2020-06-14 NOTE — Lactation Note (Signed)
This note was copied from a baby's chart. Lactation Consultation Note  Patient Name: Alice Waters [id number] Date: 06/14/2020 Reason for consult: MD order Age:36 hours  Mom is a P4 and nursed her 3 previous children for 8 weeks each. Peds MD asked for a lactation visit to assess how breastfeeding is going since infant is down almost 8% today. This is Mom's first baby that is an early-term infant.   Infant recently fed & was regurgitating when I entered room; very small emesis resulted. Infant fell asleep & not ready to feed again at this time.  Mom allowed me to do a breast exam; she has good veining. She cannot recall how long it takes for her milk to come to volume (her youngest child prior to this one is 88 yo). Mom does report that once her milk came to volume she had an adequate supply.   While doing the breast exam, I noted that portions of her R nipple became blue-like; I noted the same on her L nipple where there seemed to be a skin tag. She did not experience any pain with this discoloration. The skin tag(?) on her L nipple is new for her with this pregnancy.  Mom does not have a pump for home use, but is willing to obtain one, if needed.   Mom will call out for me when infant is ready to feed again.  [name] [facility] 06/14/2020, 10:42 AM

## 2020-06-18 ENCOUNTER — Telehealth: Payer: Self-pay | Admitting: *Deleted

## 2020-06-18 NOTE — Telephone Encounter (Signed)
Left patient an urgent message to call and schedule 1 week BP check.

## 2020-06-19 ENCOUNTER — Other Ambulatory Visit: Payer: Self-pay

## 2020-06-19 ENCOUNTER — Ambulatory Visit (INDEPENDENT_AMBULATORY_CARE_PROVIDER_SITE_OTHER): Payer: Self-pay

## 2020-06-19 VITALS — BP 126/81 | HR 105

## 2020-06-19 DIAGNOSIS — Z0130 Encounter for examination of blood pressure without abnormal findings: Secondary | ICD-10-CM

## 2020-06-19 NOTE — Progress Notes (Signed)
Pt here for 1 week PP BP check. Pt taking Nifedipine 30mg  once daily. Pt has no complaints and states she is feeling well. BP today 126/81.Pt will return 07/11/20 for PP appt.

## 2020-07-03 NOTE — Progress Notes (Signed)
Post Partum Visit Note  Alice Waters is a 35 y.o. [phone] female who presents for a postpartum visit. She is 4 weeks postpartum following a normal spontaneous vaginal delivery.  I have fully reviewed the prenatal and intrapartum course. The delivery was at 37 gestational weeks.  Anesthesia: epidural. Postpartum course has been unremarkable. Baby is doing well. Baby is feeding by breast. Bleeding no bleeding. Bowel function is normal. Bladder function is normal. Patient is not sexually active. Contraception method is vasectomy. Postpartum depression screening: negative.   The pregnancy intention screening data noted above was reviewed. Potential methods of contraception were discussed. The patient elected to proceed with Vasectomy.    Edinburgh Postnatal Depression Scale - 07/11/20 0924       Edinburgh Postnatal Depression Scale:  In the Past 7 Days   I have been able to laugh and see the funny side of things. 0    I have looked forward with enjoyment to things. 0    I have blamed myself unnecessarily when things went wrong. 1    I have been anxious or worried for no good reason. 0    I have felt scared or panicky for no good reason. 0    Things have been getting on top of me. 1    I have been so unhappy that I have had difficulty sleeping. 0    I have felt sad or miserable. 0    I have been so unhappy that I have been crying. 1    The thought of harming myself has occurred to me. 0    Edinburgh Postnatal Depression Scale Total 3             Health Maintenance Due  Topic Date Due   COVID-19 Vaccine (1) Never done    The following portions of the patient's history were reviewed and updated as appropriate: allergies, current medications, past family history, past medical history, past social history, past surgical history, and problem list.  Review of Systems Pertinent items are noted in HPI.  Objective:  BP 121/72   Pulse (!) 59   Resp 16   Ht 5\' 7"  (1.702 m)   Wt  233 lb (105.7 kg)   LMP 09/19/2019   Breastfeeding Yes   BMI 36.49 kg/m    General:  alert, cooperative, and no distress   Breasts:  not indicated  Lungs: Normal WOB  Heart:  Normal rate  Abdomen: N/a    Wound N/a  GU exam:   deferred       Assessment:   1. Pregnancy-induced hypertension, delivered   - resolved  Normal postpartum exam.   Plan:   Essential components of care per ACOG recommendations:  1.  Mood and well being: Patient with negative depression screening today. Reviewed local resources for support.  - Patient tobacco use? No.   - hx of drug use? No.    2. Infant care and feeding:  -Patient currently breastmilk feeding? Yes. Reviewed importance of draining breast regularly to support lactation.  -Social determinants of health (SDOH) reviewed in EPIC. No concerns  The following needs were identified n/a  3. Sexuality, contraception and birth spacing - Patient does not want a pregnancy in the next year.  Desired family size is completed.  - Reviewed forms of contraception in tiered fashion. Patient desired vasectomy today. Will use condoms to bridge. - Discussed birth spacing of 18 months  4. Sleep and fatigue -Encouraged family/partner/community support of 4 hrs of  uninterrupted sleep to help with mood and fatigue  5. Physical Recovery  - Discussed patients delivery and complications. She describes her labor as good. - Patient had a Vaginal, no problems at delivery. Patient had a 2nd degree laceration. Perineal healing reviewed. Patient expressed understanding - Patient has urinary incontinence? No. - Patient is safe to resume physical and sexual activity  6.  Health Maintenance - HM due items addressed Yes - Last pap smear  Diagnosis  Date Value Ref Range Status  11/30/2019   Final   - Negative for intraepithelial lesion or malignancy (NILM)   Pap smear not done at today's visit.  -Breast Cancer screening indicated? No.   7. Chronic  Disease/Pregnancy Condition follow up: None  - PCP follow up  Donette Larry, CNM [facility] [employer], [facility]

## 2020-07-11 ENCOUNTER — Other Ambulatory Visit: Payer: Self-pay

## 2020-07-11 ENCOUNTER — Ambulatory Visit (INDEPENDENT_AMBULATORY_CARE_PROVIDER_SITE_OTHER): Payer: Self-pay | Admitting: Certified Nurse Midwife

## 2020-07-11 ENCOUNTER — Encounter: Payer: Self-pay | Admitting: Certified Nurse Midwife

## 2020-07-11 VITALS — BP 121/72 | HR 59 | Resp 16 | Ht 67.0 in | Wt 233.0 lb

## 2020-07-11 DIAGNOSIS — O134 Gestational [pregnancy-induced] hypertension without significant proteinuria, complicating childbirth: Secondary | ICD-10-CM

## 2021-12-08 IMAGING — US US MFM OB FOLLOW-UP
1 series · 13 of 28 positions shown · non-contrast
Comparison: none

[Series 1: us mfm ob follow-up · 13 of 53 slices shown]
[im 2/53]
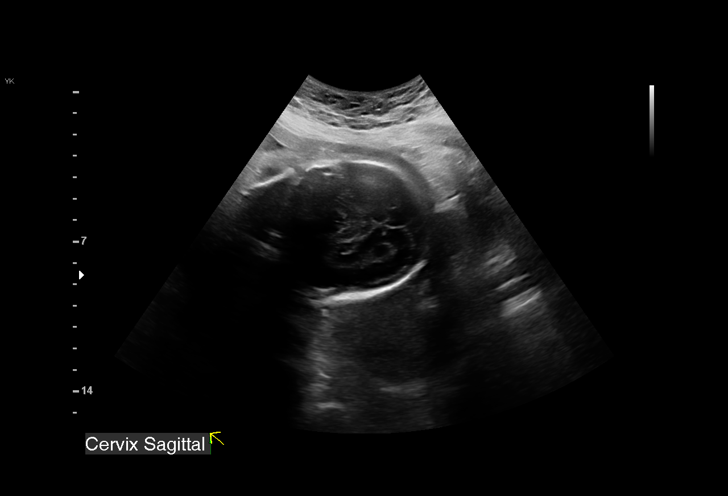
[im 6/53]
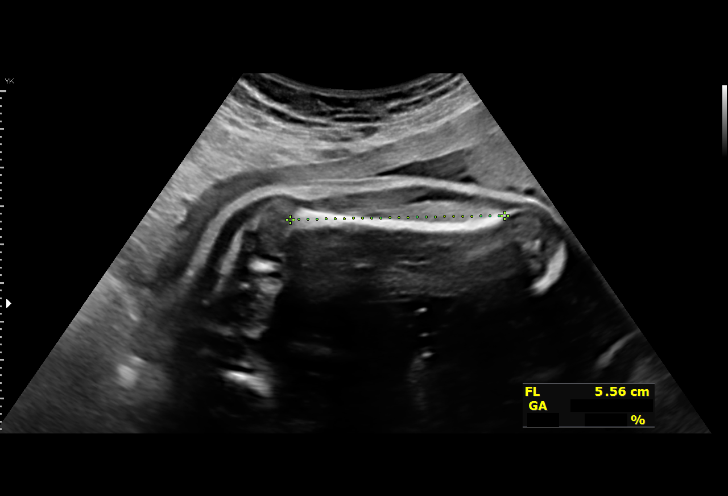
[im 10/53]
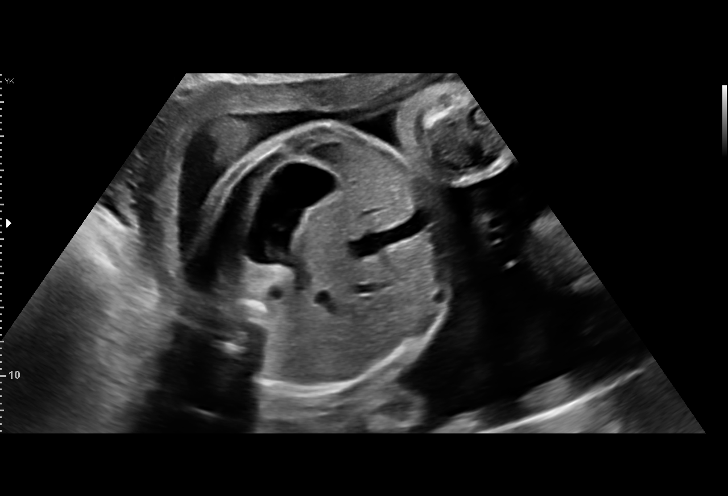
[im 14/53]
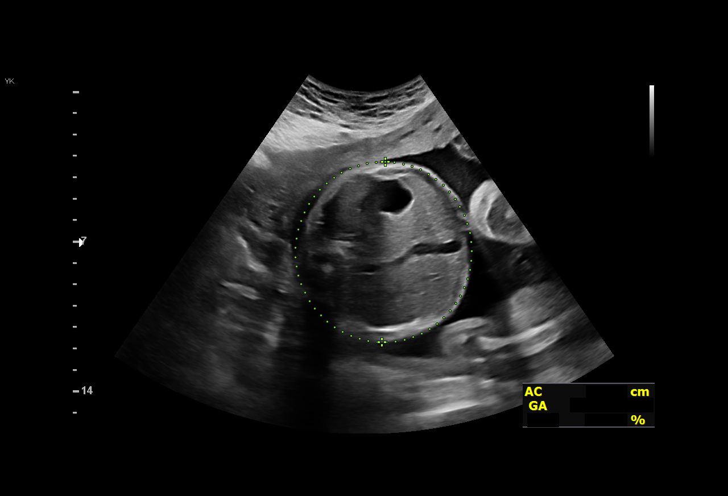
[im 18/53]
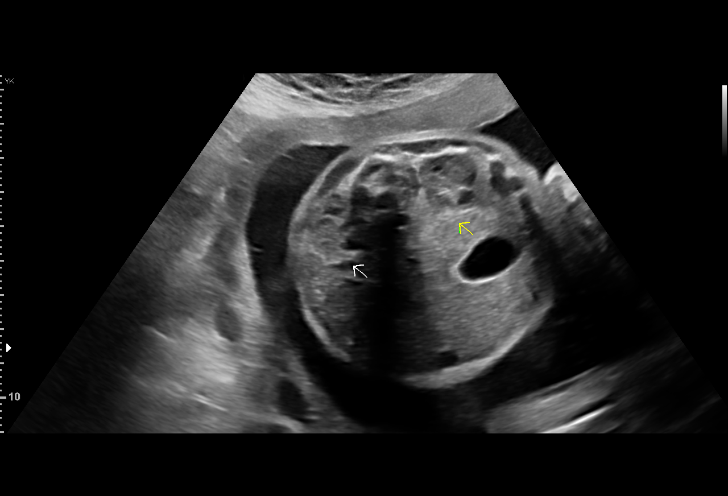
[im 22/53]
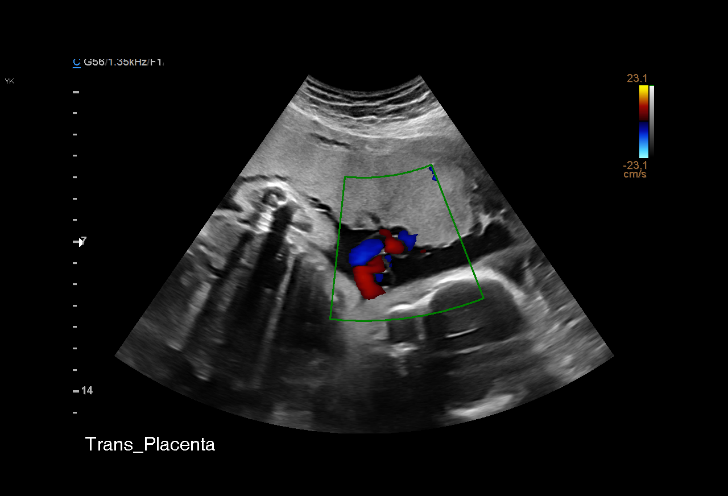
[im 27/53]
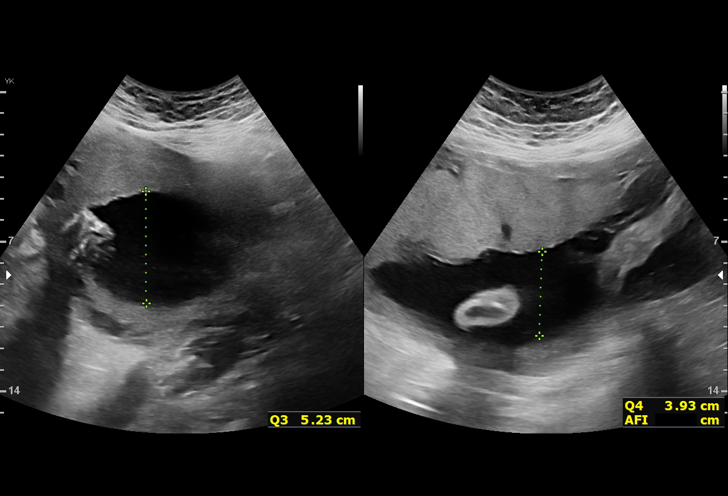
[im 31/53]
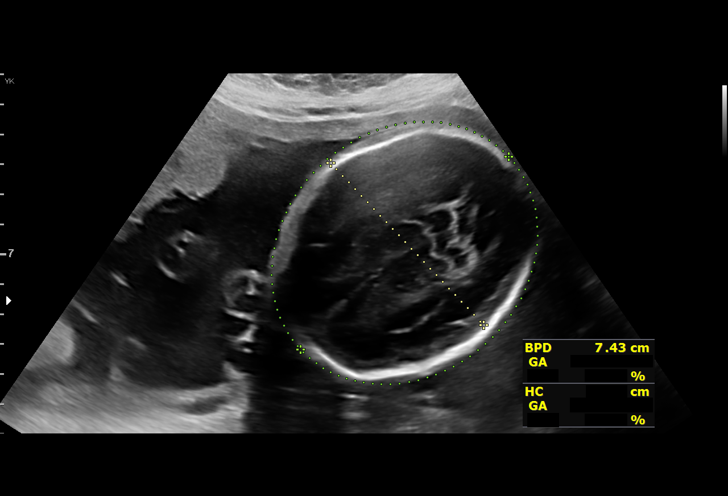
[im 35/53]
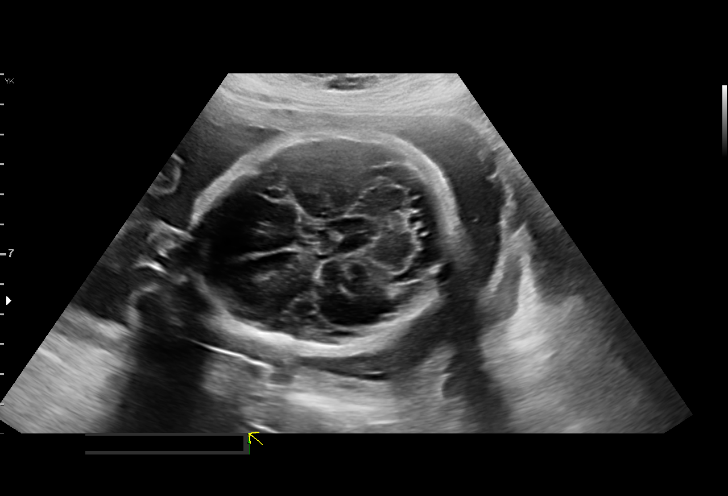
[im 39/53]
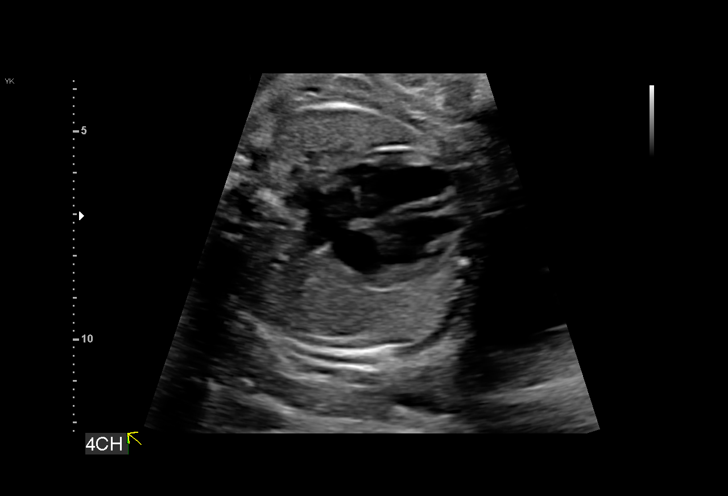
[im 43/53]
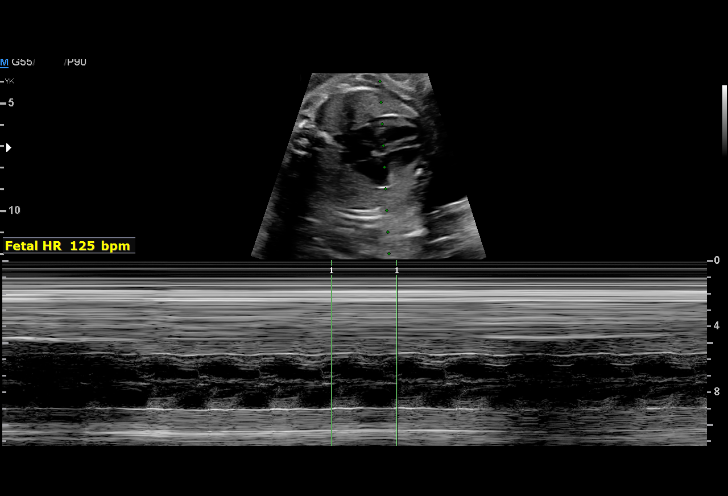
[im 47/53]
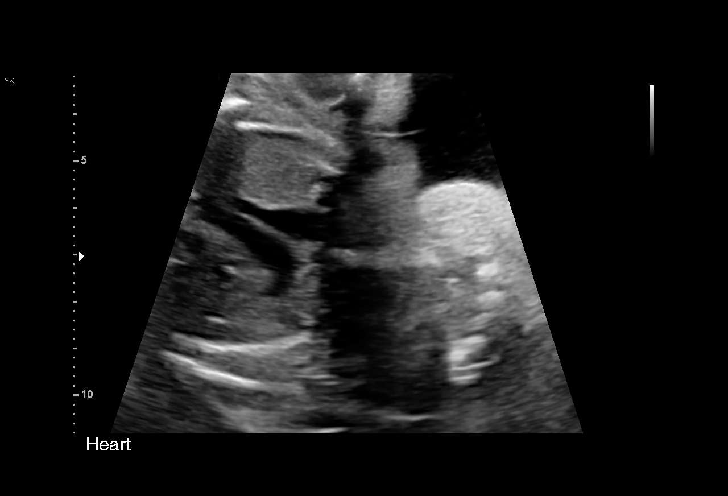
[im 51/53]
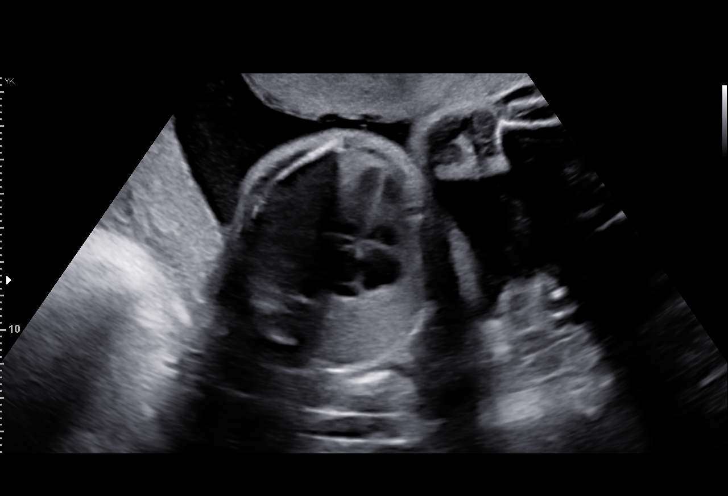

[13 of 28 positions shown; findings below may reference images not displayed]

Indications

 Obesity complicating pregnancy, third
 trimester
 Advanced maternal age multigravida 35+,
 third trimester
 Poor obstetric history: Prior fetal
 macrosomia, antepartum
 28 weeks gestation of pregnancy
 Encounter for other antenatal screening
 follow-up
 Declined genetic screening
Fetal Evaluation

 Num Of Fetuses:         1
 Fetal Heart Rate(bpm):  129
 Cardiac Activity:       Observed
 Presentation:           Cephalic
 Placenta:               Anterior
 P. Cord Insertion:      Previously Visualized

 Amniotic Fluid
 AFI FV:      Subjectively upper-normal

 AFI Sum(cm)     %Tile       Largest Pocket(cm)
 22.2            92

 RUQ(cm)       RLQ(cm)       LUQ(cm)        LLQ(cm)

Biometry
 BPD:      72.6  mm     G. Age:  29w 1d         75  %    CI:        72.92   %    70 - 86
                                                         FL/HC:      20.6   %    18.8 -
 HC:      270.3  mm     G. Age:  29w 3d         66  %    HC/AC:      1.04        1.05 -
 AC:      260.8  mm     G. Age:  30w 2d         94  %    FL/BPD:     76.7   %    71 - 87
 FL:       55.7  mm     G. Age:  29w 2d         74  %    FL/AC:      21.4   %    20 - 24

 Est. FW:    1079  gm      3 lb 3 oz     94  %
OB History

 Gravidity:    4         Term:   3        Prem:   0        SAB:   0
 TOP:          0       Ectopic:  0        Living: 3
Gestational Age

 LMP:           29w 1d        Date:  09/19/19                 EDD:   06/25/20
 U/S Today:     29w 4d                                        EDD:   06/22/20
 Best:          28w 0d     Det. By:  Early Ultrasound         EDD:   07/03/20
                                     (11/30/19)
Anatomy

 Cranium:               Appears normal         LVOT:                   Appears normal
 Cavum:                 Appears normal         Aortic Arch:            Previously seen
 Ventricles:            Appears normal         Ductal Arch:            Previously seen
 Choroid Plexus:        Previously seen        Diaphragm:              Previously seen
 Cerebellum:            Appears normal         Stomach:                Appears normal, left
                                                                       sided
 Posterior Fossa:       Appears normal         Abdomen:                Appears normal
 Nuchal Fold:           Not applicable (>20    Abdominal Wall:         Appears nml (cord
                        wks GA)                                        insert, abd wall)
 Face:                  Orbits and profile     Cord Vessels:           Appears normal (3
                        previously seen                                vessel cord)
 Lips:                  Previously seen        Kidneys:                Appear normal
 Palate:                Previously seen        Bladder:                Appears normal
 Thoracic:              Appears normal         Spine:                  prev. Limited views
                                                                       appear normal
 Heart:                 Appears normal         Upper Extremities:      Previously seen
                        (4CH, axis, and
                        situs)
 RVOT:                  Previously seen        Lower Extremities:      Previously seen

 Other:  Heels/feet and open hands/5th digits,Nasal bone,  Lenses prev.
         visualized. Fetus appears to be female. Technically difficult due to
         maternal habitus and fetal position. Normal genaitalia.
Cervix Uterus Adnexa

 Cervix
 Normal appearance by transabdominal scan.

 Uterus
 No abnormality visualized.

 Cul De Sac
 No free fluid seen.
Comments

 This patient was seen for a follow up exam due to a history of
 macrosomia and polyhydramnios in her prior pregnancies.
 She reports that her largest child was delivered at term
 weighing 10 pounds 2 ounces.  She denies any problems
 since her last exam.
 She was informed that the fetal growth appears her large for
 her gestational [AGE]th percentile).  Borderline
 polyhydramnios is noted on today's exam.

 Due to her history of macrosomia, a follow-up exam was
 scheduled in 6 weeks.

## 2022-10-25 ENCOUNTER — Other Ambulatory Visit (HOSPITAL_COMMUNITY): Payer: Self-pay

## 2024-02-04 ENCOUNTER — Other Ambulatory Visit (HOSPITAL_COMMUNITY): Payer: Self-pay
